# Patient Record
Sex: Female | Born: 1955 | Race: White | Hispanic: No | Smoking: Never smoker
Health system: Southern US, Community
[De-identification: ages and names within clinical notes are randomized; demographics above are authoritative.]

## PROBLEM LIST (undated history)

## (undated) DIAGNOSIS — J309 Allergic rhinitis, unspecified: Secondary | ICD-10-CM

## (undated) DIAGNOSIS — E669 Obesity, unspecified: Secondary | ICD-10-CM

## (undated) DIAGNOSIS — N183 Chronic kidney disease, stage 3 unspecified: Secondary | ICD-10-CM

## (undated) DIAGNOSIS — R7303 Prediabetes: Secondary | ICD-10-CM

## (undated) DIAGNOSIS — J329 Chronic sinusitis, unspecified: Secondary | ICD-10-CM

## (undated) DIAGNOSIS — K7581 Nonalcoholic steatohepatitis (NASH): Secondary | ICD-10-CM

## (undated) DIAGNOSIS — M1990 Unspecified osteoarthritis, unspecified site: Secondary | ICD-10-CM

## (undated) DIAGNOSIS — M8580 Other specified disorders of bone density and structure, unspecified site: Secondary | ICD-10-CM

## (undated) DIAGNOSIS — E7800 Pure hypercholesterolemia, unspecified: Secondary | ICD-10-CM

## (undated) DIAGNOSIS — F32A Depression, unspecified: Secondary | ICD-10-CM

## (undated) DIAGNOSIS — E039 Hypothyroidism, unspecified: Secondary | ICD-10-CM

## (undated) DIAGNOSIS — K219 Gastro-esophageal reflux disease without esophagitis: Secondary | ICD-10-CM

## (undated) DIAGNOSIS — E559 Vitamin D deficiency, unspecified: Secondary | ICD-10-CM

## (undated) DIAGNOSIS — E538 Deficiency of other specified B group vitamins: Secondary | ICD-10-CM

## (undated) DIAGNOSIS — F988 Other specified behavioral and emotional disorders with onset usually occurring in childhood and adolescence: Secondary | ICD-10-CM

## (undated) DIAGNOSIS — M461 Sacroiliitis, not elsewhere classified: Secondary | ICD-10-CM

## (undated) DIAGNOSIS — G43109 Migraine with aura, not intractable, without status migrainosus: Secondary | ICD-10-CM

## (undated) HISTORY — DX: Prediabetes: R73.03

## (undated) HISTORY — DX: Other specified disorders of bone density and structure, unspecified site: M85.80

## (undated) HISTORY — DX: Nonalcoholic steatohepatitis (NASH): K75.81

## (undated) HISTORY — DX: Sacroiliitis, not elsewhere classified: M46.1

## (undated) HISTORY — DX: Chronic sinusitis, unspecified: J32.9

## (undated) HISTORY — DX: Chronic kidney disease, stage 3 unspecified: N18.30

## (undated) HISTORY — PX: OTHER SURGICAL HISTORY: SHX169

## (undated) HISTORY — DX: Pure hypercholesterolemia, unspecified: E78.00

## (undated) HISTORY — DX: Allergic rhinitis, unspecified: J30.9

## (undated) HISTORY — DX: Unspecified osteoarthritis, unspecified site: M19.90

## (undated) HISTORY — DX: Obesity, unspecified: E66.9

## (undated) HISTORY — DX: Other specified behavioral and emotional disorders with onset usually occurring in childhood and adolescence: F98.8

## (undated) HISTORY — PX: ROTATOR CUFF REPAIR: SHX139

## (undated) HISTORY — DX: Vitamin D deficiency, unspecified: E55.9

## (undated) HISTORY — DX: Depression, unspecified: F32.A

## (undated) HISTORY — DX: Deficiency of other specified B group vitamins: E53.8

## (undated) HISTORY — DX: Hypothyroidism, unspecified: E03.9

## (undated) HISTORY — DX: Gastro-esophageal reflux disease without esophagitis: K21.9

## (undated) HISTORY — DX: Migraine with aura, not intractable, without status migrainosus: G43.109

## (undated) HISTORY — PX: CHOLECYSTECTOMY: SHX55

---

## 1997-10-25 ENCOUNTER — Encounter: Admission: RE | Admit: 1997-10-25 | Discharge: 1997-10-25 | Payer: Self-pay | Admitting: Family Medicine

## 1999-04-21 ENCOUNTER — Inpatient Hospital Stay (HOSPITAL_COMMUNITY): Admission: EM | Admit: 1999-04-21 | Discharge: 1999-04-28 | Payer: Self-pay | Admitting: *Deleted

## 1999-04-21 ENCOUNTER — Encounter: Payer: Self-pay | Admitting: *Deleted

## 1999-04-21 ENCOUNTER — Encounter: Payer: Self-pay | Admitting: Surgery

## 1999-04-23 ENCOUNTER — Encounter: Payer: Self-pay | Admitting: General Surgery

## 1999-04-30 ENCOUNTER — Inpatient Hospital Stay (HOSPITAL_COMMUNITY): Admission: EM | Admit: 1999-04-30 | Discharge: 1999-05-02 | Payer: Self-pay | Admitting: Emergency Medicine

## 1999-04-30 ENCOUNTER — Encounter: Payer: Self-pay | Admitting: General Surgery

## 1999-04-30 ENCOUNTER — Ambulatory Visit (HOSPITAL_COMMUNITY): Admission: RE | Admit: 1999-04-30 | Discharge: 1999-04-30 | Payer: Self-pay

## 2000-07-31 ENCOUNTER — Encounter: Payer: Self-pay | Admitting: Family Medicine

## 2000-07-31 ENCOUNTER — Encounter: Admission: RE | Admit: 2000-07-31 | Discharge: 2000-07-31 | Payer: Self-pay | Admitting: Family Medicine

## 2000-09-09 ENCOUNTER — Encounter: Payer: Self-pay | Admitting: Surgery

## 2000-09-09 ENCOUNTER — Encounter: Admission: RE | Admit: 2000-09-09 | Discharge: 2000-09-09 | Payer: Self-pay | Admitting: Surgery

## 2003-09-11 ENCOUNTER — Emergency Department (HOSPITAL_COMMUNITY): Admission: EM | Admit: 2003-09-11 | Discharge: 2003-09-11 | Payer: Self-pay | Admitting: Emergency Medicine

## 2003-09-19 ENCOUNTER — Encounter: Admission: RE | Admit: 2003-09-19 | Discharge: 2003-09-19 | Payer: Self-pay | Admitting: Family Medicine

## 2003-09-25 ENCOUNTER — Ambulatory Visit (HOSPITAL_COMMUNITY): Admission: RE | Admit: 2003-09-25 | Discharge: 2003-09-26 | Payer: Self-pay | Admitting: General Surgery

## 2011-08-25 ENCOUNTER — Other Ambulatory Visit (HOSPITAL_COMMUNITY): Payer: Self-pay | Admitting: Family Medicine

## 2011-08-25 DIAGNOSIS — Z1231 Encounter for screening mammogram for malignant neoplasm of breast: Secondary | ICD-10-CM

## 2011-09-03 ENCOUNTER — Ambulatory Visit (HOSPITAL_COMMUNITY): Payer: Self-pay

## 2011-10-06 ENCOUNTER — Ambulatory Visit (HOSPITAL_COMMUNITY): Payer: Self-pay

## 2011-10-31 ENCOUNTER — Ambulatory Visit (HOSPITAL_COMMUNITY)
Admission: RE | Admit: 2011-10-31 | Discharge: 2011-10-31 | Disposition: A | Discharge status: Home / Self Care | Payer: BC Managed Care – PPO | Source: Ambulatory Visit | Attending: Family Medicine | Admitting: Family Medicine

## 2011-10-31 DIAGNOSIS — Z1231 Encounter for screening mammogram for malignant neoplasm of breast: Secondary | ICD-10-CM

## 2013-05-31 ENCOUNTER — Other Ambulatory Visit: Payer: Self-pay | Admitting: Gastroenterology

## 2013-05-31 DIAGNOSIS — R7989 Other specified abnormal findings of blood chemistry: Secondary | ICD-10-CM

## 2013-06-03 ENCOUNTER — Ambulatory Visit
Admission: RE | Admit: 2013-06-03 | Discharge: 2013-06-03 | Disposition: A | Discharge status: Home / Self Care | Payer: BC Managed Care – PPO | Source: Ambulatory Visit | Attending: Gastroenterology | Admitting: Gastroenterology

## 2013-06-03 DIAGNOSIS — R7989 Other specified abnormal findings of blood chemistry: Secondary | ICD-10-CM

## 2014-01-06 ENCOUNTER — Other Ambulatory Visit (HOSPITAL_COMMUNITY): Payer: Self-pay | Admitting: Gastroenterology

## 2014-01-06 DIAGNOSIS — R945 Abnormal results of liver function studies: Principal | ICD-10-CM

## 2014-01-06 DIAGNOSIS — R7989 Other specified abnormal findings of blood chemistry: Secondary | ICD-10-CM

## 2014-01-10 ENCOUNTER — Encounter (HOSPITAL_COMMUNITY): Payer: Self-pay | Admitting: Pharmacy Technician

## 2014-01-12 ENCOUNTER — Other Ambulatory Visit: Payer: Self-pay | Admitting: Radiology

## 2014-01-16 ENCOUNTER — Ambulatory Visit (HOSPITAL_COMMUNITY)
Admission: RE | Admit: 2014-01-16 | Discharge: 2014-01-16 | Disposition: A | Discharge status: Home / Self Care | Payer: BC Managed Care – PPO | Source: Ambulatory Visit | Attending: Gastroenterology | Admitting: Gastroenterology

## 2014-01-16 DIAGNOSIS — K7689 Other specified diseases of liver: Secondary | ICD-10-CM | POA: Insufficient documentation

## 2014-01-16 DIAGNOSIS — R945 Abnormal results of liver function studies: Secondary | ICD-10-CM

## 2014-01-16 DIAGNOSIS — R7989 Other specified abnormal findings of blood chemistry: Secondary | ICD-10-CM | POA: Insufficient documentation

## 2014-01-16 LAB — CBC
HEMATOCRIT: 38.9 % (ref 36.0–46.0)
HEMOGLOBIN: 13.2 g/dL (ref 12.0–15.0)
MCH: 27.8 pg (ref 26.0–34.0)
MCHC: 33.9 g/dL (ref 30.0–36.0)
MCV: 82.1 fL (ref 78.0–100.0)
Platelets: 212 10*3/uL (ref 150–400)
RBC: 4.74 MIL/uL (ref 3.87–5.11)
RDW: 15.5 % (ref 11.5–15.5)
WBC: 6.2 10*3/uL (ref 4.0–10.5)

## 2014-01-16 LAB — APTT: APTT: 29 s (ref 24–37)

## 2014-01-16 LAB — PROTIME-INR
INR: 0.99 (ref 0.00–1.49)
PROTHROMBIN TIME: 13.1 s (ref 11.6–15.2)

## 2014-01-16 MED ORDER — MIDAZOLAM HCL 2 MG/2ML IJ SOLN
INTRAMUSCULAR | Status: AC
  Filled 2014-01-16: qty 6

## 2014-01-16 MED ORDER — HYDROCODONE-ACETAMINOPHEN 5-325 MG PO TABS: 1.0000 | ORAL_TABLET | ORAL | Status: DC | PRN

## 2014-01-16 MED ORDER — GELATIN ABSORBABLE 12-7 MM EX MISC
CUTANEOUS | Status: AC
  Filled 2014-01-16: qty 1

## 2014-01-16 MED ORDER — FENTANYL CITRATE 0.05 MG/ML IJ SOLN
INTRAMUSCULAR | Status: AC
  Filled 2014-01-16: qty 4

## 2014-01-16 MED ORDER — FENTANYL CITRATE 0.05 MG/ML IJ SOLN
INTRAMUSCULAR | Status: AC | PRN
  Administered 2014-01-16 (×2): 50 ug via INTRAVENOUS

## 2014-01-16 MED ORDER — MIDAZOLAM HCL 2 MG/2ML IJ SOLN
INTRAMUSCULAR | Status: AC | PRN
  Administered 2014-01-16: 1 mg via INTRAVENOUS
  Administered 2014-01-16: 2 mg via INTRAVENOUS

## 2014-01-16 MED ORDER — SODIUM CHLORIDE 0.9 % IV SOLN: INTRAVENOUS | Status: DC
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
## 2014-01-16 NOTE — Procedures (Signed)
Interventional Radiology Procedure Note  Procedure: US guided random liver biopsy Complications: None Recommendations: - Bedrest x 3 hrs  Signed,  Criselda Peaches, MD Vascular & Interventional Radiologist [facility]

## 2014-01-16 NOTE — Discharge Instructions (Signed)
Liver Biopsy, Care After °These instructions give you information on caring for yourself after your procedure. Your doctor may also give you more specific instructions. Call your doctor if you have any problems or questions after your procedure. °HOME CARE °· Rest at home for 1-2 days or as told by your doctor. °· Have someone stay with you for at least 24 hours. °· Do not do these things in the first 24 hours: °¨ Drive. °¨ Use machinery. °¨ Take care of other people. °¨ Sign legal documents. °¨ Take a bath or shower. °· There are many different ways to close and cover a cut (incision). For example, a cut can be closed with stitches, skin glue, or adhesive strips. Follow your doctor's instructions on: °¨ Taking care of your cut. °¨ Changing and removing your bandage (dressing). °¨ Removing whatever was used to close your cut. °· Do not drink alcohol in the first week. °· Do not lift more than 5 pounds or play contact sports for the first 2 weeks. °· Take medicines only as told by your doctor. For 1 week, do not take medicine that has aspirin in it or medicines like ibuprofen. °· Get your test results. °GET HELP IF: °· A cut bleeds and leaves more than just a small spot of blood. °· A cut is red, puffs up (swells), or hurts more than before. °· Fluid or something else comes from a cut. °· A cut smells bad. °· You have a fever or chills. °GET HELP RIGHT AWAY IF: °· You have swelling, bloating, or pain in your belly (abdomen). °· You get dizzy or faint. °· You have a rash. °· You feel sick to your stomach (nauseous) or throw up (vomit). °· You have trouble breathing, feel short of breath, or feel faint. °· Your chest hurts. °· You have problems talking or seeing. °· You have trouble balancing or moving your arms or legs. °Document Released: 03/11/2008 Document Revised: 10/17/2013 Document Reviewed: 07/29/2013 °ExitCare® Patient Information ©2015 [employer]. This information is not intended to replace advice given to  you by your health care provider. Make sure you discuss any questions you have with your health care provider. ° °

## 2015-09-08 IMAGING — US US BIOPSY
1 series · 8 of 8 positions shown · non-contrast
Comparison: none

CLINICAL DATA: 58-year-old female with hepatic steatosis and
elevated liver enzymes. Random liver biopsy to assess for
fibrosis/cirrhosis.
TECHNIQUE: Informed consent was obtained from the patient following explanation
of the procedure, risks, benefits and alternatives. The patient
understands, agrees and consents for the procedure. All questions
were addressed. A time out was performed.

[Series 1: us biopsy · 0.22mm/px · 8 acquisitions, 8 frames shown]
[im 1/8]
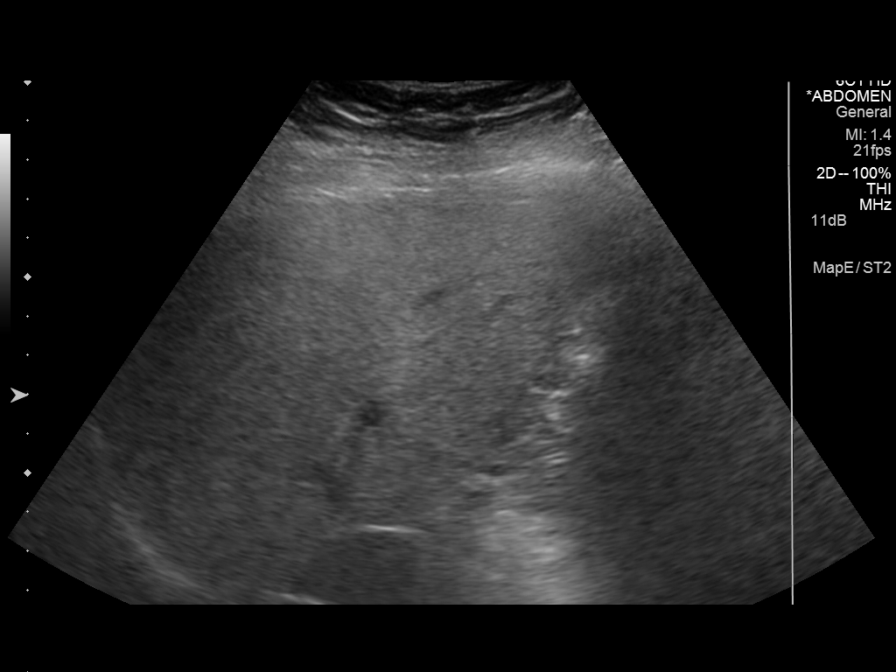
[im 2/8]
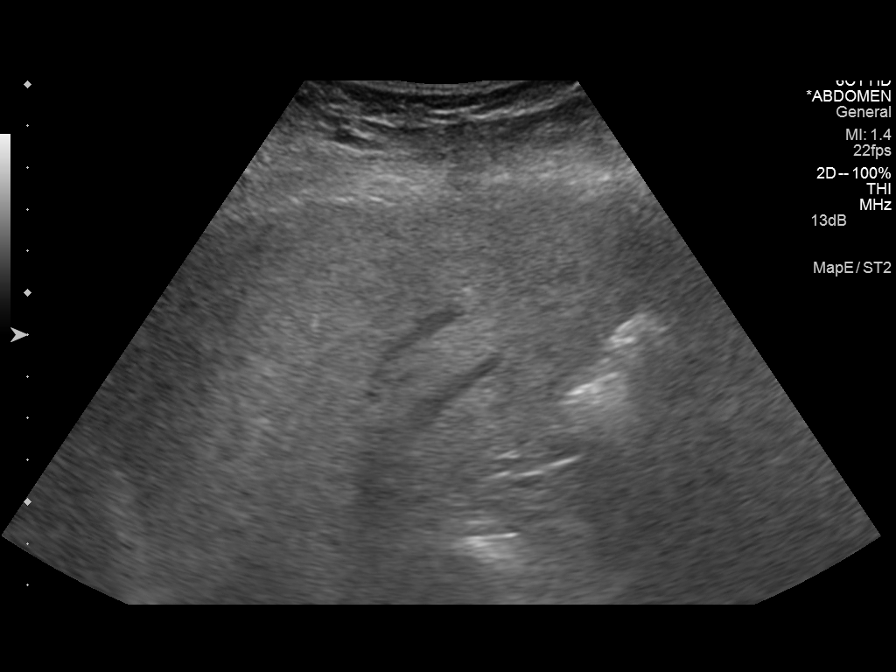
[im 3/8]
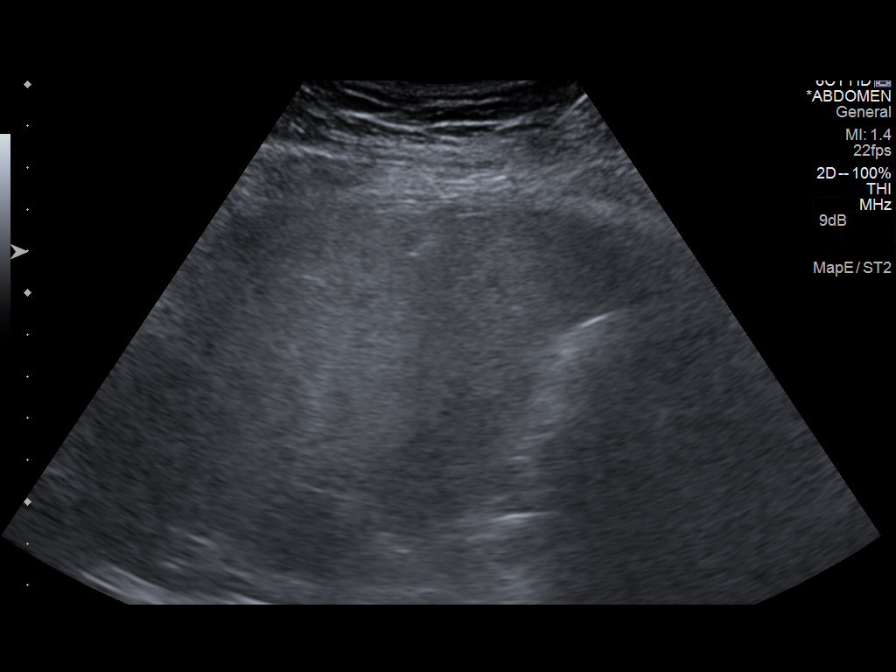
[im 4/8]
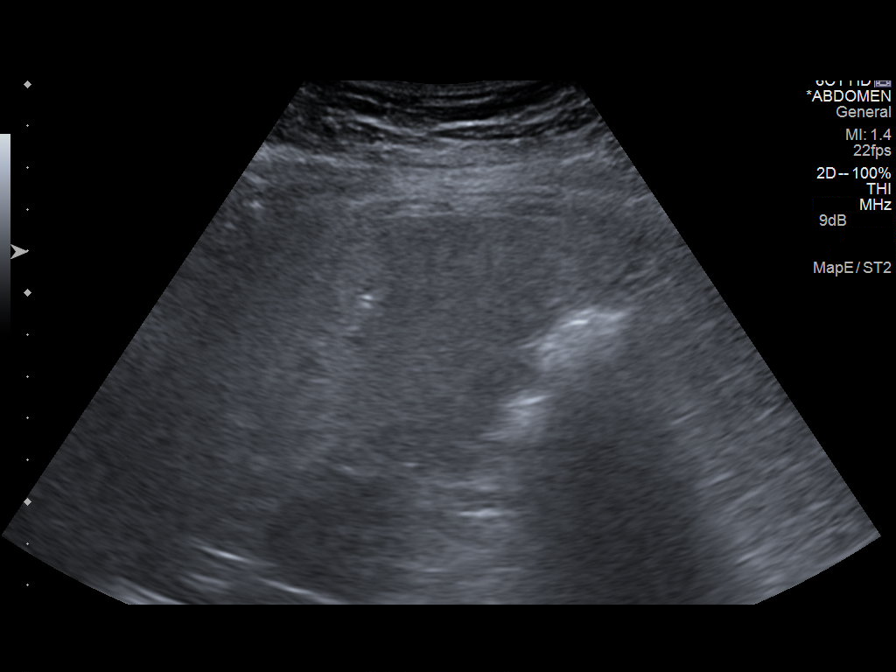
[im 5/8]
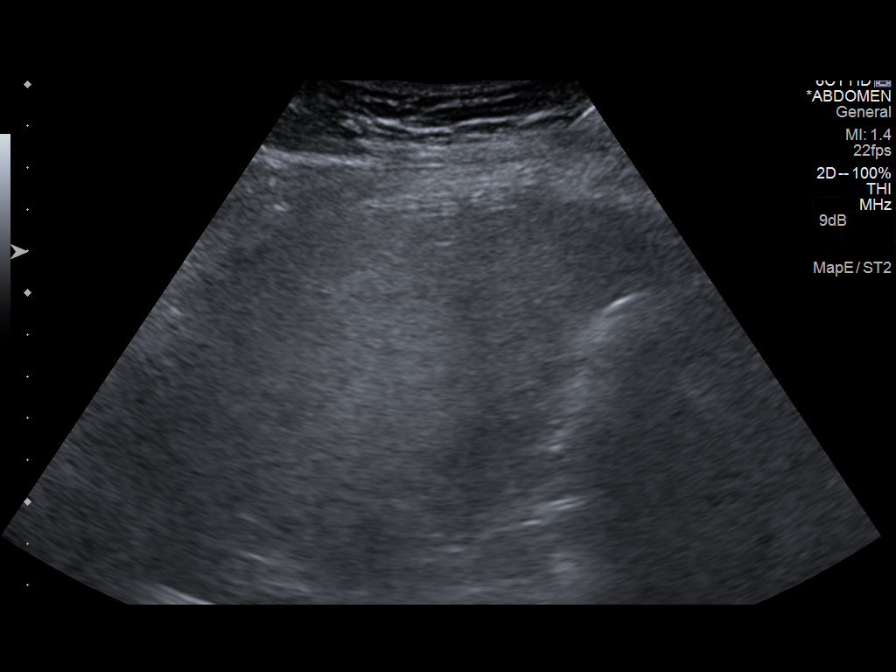
[im 6/8]
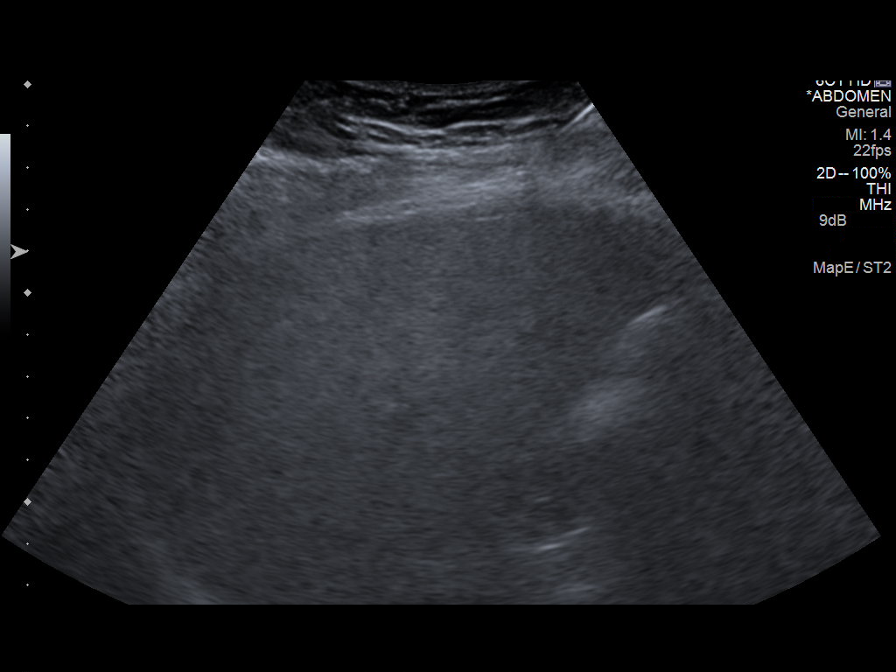
[im 7/8]
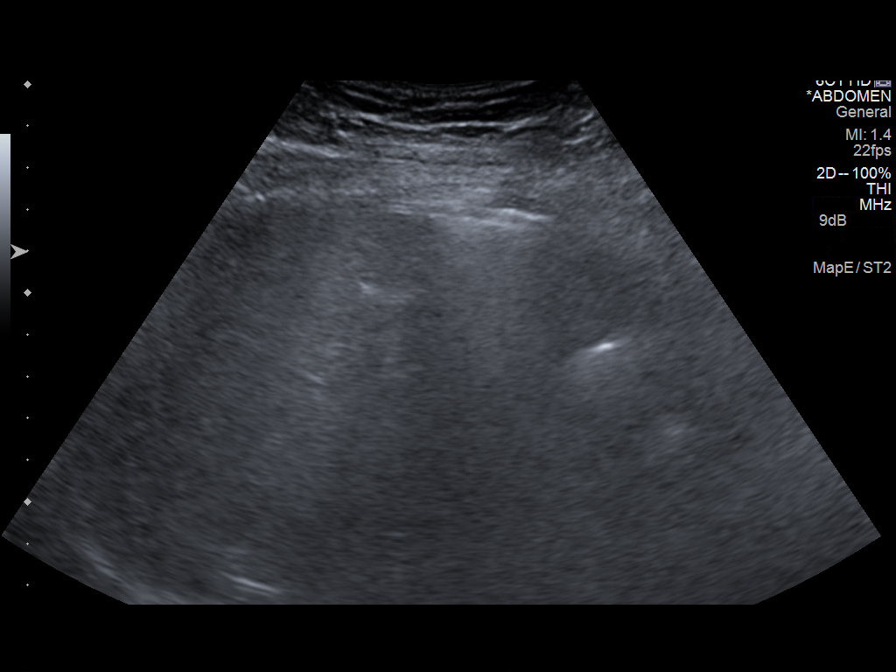
[im 8/8]
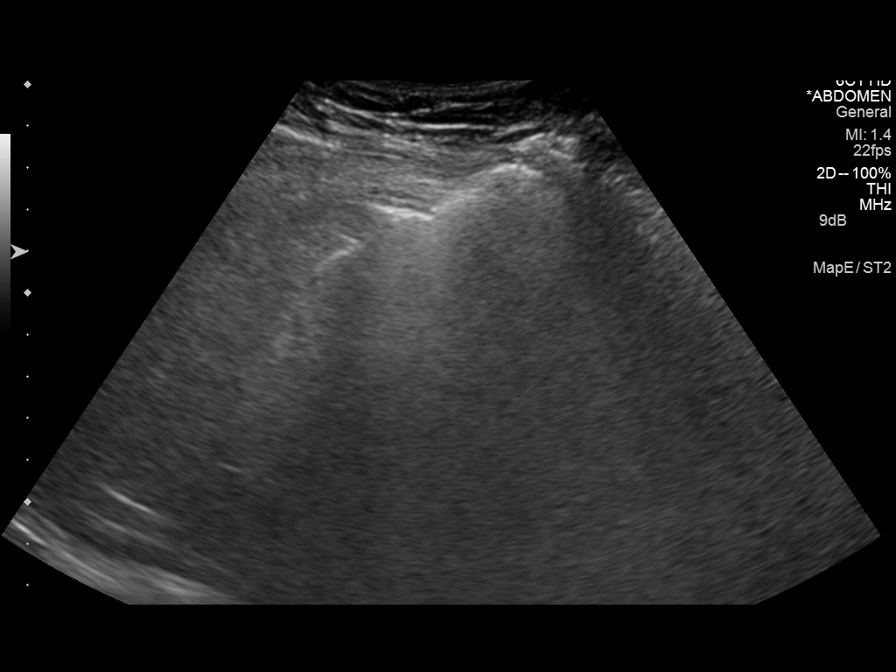

[8 of 8 positions shown; findings below may reference images not displayed]

EXAM:
ULTRASOUND BIOPSY CORE LIVER

Date: 01/16/2014

PROCEDURE:
1. Ultrasound-guided random liver biopsy

ANESTHESIA/SEDATION:
Moderate (conscious) sedation was used. 31 mg Versed, 100 mcg
Fentanyl were administered intravenously. The patient's vital signs
were monitored continuously by radiology nursing throughout the
procedure.

Sedation Time: 14 minutes
The right upper quadrant was interrogated with ultrasound. A
relatively avascular segment of the right hepatic lobe was
identified. A suitable skin entry site was selected and marked. The
region was then sterilely prepped and draped in the standard fashion
using Betadine skin prep.

Local anesthesia was attained by infiltration with 1% lidocaine. A
small dermatotomy was made. Under real-time sonographic guidance, a
17 gauge trocar needle was advanced through the right upper quadrant
anterior abdominal wall and into the margin of the liver. Multiple
18 gauge core biopsies were then coaxially obtained using the
BioPince automated biopsy device. Ultrasound imaging confirmed
intrahepatic location on all needle passes. The biopsy specimens
were placed in formalin and delivered to pathology for further
analysis.

As the 17 gauge trocar needle was removed the biopsy tract was
embolized with a Gel-Foam slurry. Post biopsy imaging demonstrates
no active hemorrhage or perihepatic hematoma. The patient tolerated
the procedure very well.
IMPRESSION: Technically successful ultrasound-guided random core biopsy of the
liver.

## 2015-10-01 DIAGNOSIS — F329 Major depressive disorder, single episode, unspecified: Secondary | ICD-10-CM | POA: Diagnosis not present

## 2015-10-10 DIAGNOSIS — M179 Osteoarthritis of knee, unspecified: Secondary | ICD-10-CM | POA: Diagnosis not present

## 2015-10-10 DIAGNOSIS — R002 Palpitations: Secondary | ICD-10-CM | POA: Diagnosis not present

## 2015-10-10 DIAGNOSIS — E538 Deficiency of other specified B group vitamins: Secondary | ICD-10-CM | POA: Diagnosis not present

## 2015-10-16 DIAGNOSIS — K760 Fatty (change of) liver, not elsewhere classified: Secondary | ICD-10-CM | POA: Diagnosis not present

## 2015-10-16 DIAGNOSIS — D120 Benign neoplasm of cecum: Secondary | ICD-10-CM | POA: Diagnosis not present

## 2015-11-07 DIAGNOSIS — M1712 Unilateral primary osteoarthritis, left knee: Secondary | ICD-10-CM | POA: Diagnosis not present

## 2015-11-09 ENCOUNTER — Other Ambulatory Visit: Payer: Self-pay | Admitting: Physician Assistant

## 2015-11-09 DIAGNOSIS — K760 Fatty (change of) liver, not elsewhere classified: Secondary | ICD-10-CM | POA: Diagnosis not present

## 2015-11-09 DIAGNOSIS — M1712 Unilateral primary osteoarthritis, left knee: Secondary | ICD-10-CM

## 2015-11-16 ENCOUNTER — Ambulatory Visit
Admission: RE | Admit: 2015-11-16 | Discharge: 2015-11-16 | Disposition: A | Discharge status: Home / Self Care | Payer: BLUE CROSS/BLUE SHIELD | Source: Ambulatory Visit | Attending: Physician Assistant | Admitting: Physician Assistant

## 2015-11-16 DIAGNOSIS — M1712 Unilateral primary osteoarthritis, left knee: Secondary | ICD-10-CM

## 2015-11-16 DIAGNOSIS — M25462 Effusion, left knee: Secondary | ICD-10-CM | POA: Diagnosis not present

## 2015-12-14 DIAGNOSIS — M1712 Unilateral primary osteoarthritis, left knee: Secondary | ICD-10-CM | POA: Diagnosis not present

## 2016-01-28 DIAGNOSIS — K7581 Nonalcoholic steatohepatitis (NASH): Secondary | ICD-10-CM | POA: Diagnosis not present

## 2016-01-30 ENCOUNTER — Other Ambulatory Visit (HOSPITAL_COMMUNITY): Payer: Self-pay | Admitting: Gastroenterology

## 2016-01-30 DIAGNOSIS — K7581 Nonalcoholic steatohepatitis (NASH): Secondary | ICD-10-CM

## 2016-03-07 DIAGNOSIS — M1712 Unilateral primary osteoarthritis, left knee: Secondary | ICD-10-CM | POA: Diagnosis not present

## 2016-03-12 ENCOUNTER — Ambulatory Visit (HOSPITAL_COMMUNITY)
Admission: RE | Admit: 2016-03-12 | Discharge: 2016-03-12 | Disposition: A | Discharge status: Home / Self Care | Payer: BLUE CROSS/BLUE SHIELD | Source: Ambulatory Visit | Attending: Gastroenterology | Admitting: Gastroenterology

## 2016-03-12 DIAGNOSIS — K7581 Nonalcoholic steatohepatitis (NASH): Secondary | ICD-10-CM | POA: Diagnosis not present

## 2016-03-12 DIAGNOSIS — Z9049 Acquired absence of other specified parts of digestive tract: Secondary | ICD-10-CM | POA: Insufficient documentation

## 2016-03-14 DIAGNOSIS — M1712 Unilateral primary osteoarthritis, left knee: Secondary | ICD-10-CM | POA: Diagnosis not present

## 2016-03-24 DIAGNOSIS — K760 Fatty (change of) liver, not elsewhere classified: Secondary | ICD-10-CM | POA: Diagnosis not present

## 2016-03-26 DIAGNOSIS — M1712 Unilateral primary osteoarthritis, left knee: Secondary | ICD-10-CM | POA: Diagnosis not present

## 2016-03-31 DIAGNOSIS — K7581 Nonalcoholic steatohepatitis (NASH): Secondary | ICD-10-CM | POA: Diagnosis not present

## 2016-03-31 DIAGNOSIS — K760 Fatty (change of) liver, not elsewhere classified: Secondary | ICD-10-CM | POA: Diagnosis not present

## 2016-04-02 DIAGNOSIS — F329 Major depressive disorder, single episode, unspecified: Secondary | ICD-10-CM | POA: Diagnosis not present

## 2016-04-07 DIAGNOSIS — Z23 Encounter for immunization: Secondary | ICD-10-CM | POA: Diagnosis not present

## 2016-04-07 DIAGNOSIS — E538 Deficiency of other specified B group vitamins: Secondary | ICD-10-CM | POA: Diagnosis not present

## 2016-04-07 DIAGNOSIS — R1010 Upper abdominal pain, unspecified: Secondary | ICD-10-CM | POA: Diagnosis not present

## 2016-04-16 DIAGNOSIS — K7581 Nonalcoholic steatohepatitis (NASH): Secondary | ICD-10-CM | POA: Diagnosis not present

## 2016-04-18 DIAGNOSIS — R1032 Left lower quadrant pain: Secondary | ICD-10-CM | POA: Diagnosis not present

## 2016-04-18 DIAGNOSIS — K7581 Nonalcoholic steatohepatitis (NASH): Secondary | ICD-10-CM | POA: Diagnosis not present

## 2016-04-25 ENCOUNTER — Other Ambulatory Visit: Payer: Self-pay | Admitting: Family Medicine

## 2016-04-25 DIAGNOSIS — R1032 Left lower quadrant pain: Secondary | ICD-10-CM

## 2016-05-02 ENCOUNTER — Ambulatory Visit
Admission: RE | Admit: 2016-05-02 | Discharge: 2016-05-02 | Disposition: A | Discharge status: Home / Self Care | Payer: BLUE CROSS/BLUE SHIELD | Source: Ambulatory Visit | Attending: Family Medicine | Admitting: Family Medicine

## 2016-05-02 DIAGNOSIS — R1032 Left lower quadrant pain: Secondary | ICD-10-CM | POA: Diagnosis not present

## 2016-05-02 MED ORDER — IOPAMIDOL (ISOVUE-300) INJECTION 61%
100.0000 mL | Freq: Once | INTRAVENOUS | Status: AC | PRN
  Administered 2016-05-02: 100 mL via INTRAVENOUS

## 2016-05-21 DIAGNOSIS — Z1231 Encounter for screening mammogram for malignant neoplasm of breast: Secondary | ICD-10-CM | POA: Diagnosis not present

## 2016-05-21 DIAGNOSIS — M8588 Other specified disorders of bone density and structure, other site: Secondary | ICD-10-CM | POA: Diagnosis not present

## 2016-06-19 DIAGNOSIS — M1712 Unilateral primary osteoarthritis, left knee: Secondary | ICD-10-CM | POA: Diagnosis not present

## 2016-07-21 DIAGNOSIS — F329 Major depressive disorder, single episode, unspecified: Secondary | ICD-10-CM | POA: Diagnosis not present

## 2016-09-08 DIAGNOSIS — K7581 Nonalcoholic steatohepatitis (NASH): Secondary | ICD-10-CM | POA: Insufficient documentation

## 2016-09-08 DIAGNOSIS — K760 Fatty (change of) liver, not elsewhere classified: Secondary | ICD-10-CM | POA: Diagnosis not present

## 2016-09-26 DIAGNOSIS — M1712 Unilateral primary osteoarthritis, left knee: Secondary | ICD-10-CM | POA: Diagnosis not present

## 2016-10-02 DIAGNOSIS — M1712 Unilateral primary osteoarthritis, left knee: Secondary | ICD-10-CM | POA: Diagnosis not present

## 2016-10-10 DIAGNOSIS — M1712 Unilateral primary osteoarthritis, left knee: Secondary | ICD-10-CM | POA: Diagnosis not present

## 2016-10-22 DIAGNOSIS — E538 Deficiency of other specified B group vitamins: Secondary | ICD-10-CM | POA: Diagnosis not present

## 2016-10-22 DIAGNOSIS — E039 Hypothyroidism, unspecified: Secondary | ICD-10-CM | POA: Diagnosis not present

## 2016-10-22 DIAGNOSIS — E559 Vitamin D deficiency, unspecified: Secondary | ICD-10-CM | POA: Diagnosis not present

## 2016-10-22 DIAGNOSIS — Z79899 Other long term (current) drug therapy: Secondary | ICD-10-CM | POA: Diagnosis not present

## 2016-11-14 DIAGNOSIS — Z01419 Encounter for gynecological examination (general) (routine) without abnormal findings: Secondary | ICD-10-CM | POA: Diagnosis not present

## 2016-11-14 DIAGNOSIS — Z6830 Body mass index (BMI) 30.0-30.9, adult: Secondary | ICD-10-CM | POA: Diagnosis not present

## 2017-03-23 DIAGNOSIS — F329 Major depressive disorder, single episode, unspecified: Secondary | ICD-10-CM | POA: Diagnosis not present

## 2017-04-03 DIAGNOSIS — Z713 Dietary counseling and surveillance: Secondary | ICD-10-CM | POA: Diagnosis not present

## 2017-04-16 DIAGNOSIS — N898 Other specified noninflammatory disorders of vagina: Secondary | ICD-10-CM | POA: Diagnosis not present

## 2017-04-17 DIAGNOSIS — E039 Hypothyroidism, unspecified: Secondary | ICD-10-CM | POA: Diagnosis not present

## 2017-04-17 DIAGNOSIS — E538 Deficiency of other specified B group vitamins: Secondary | ICD-10-CM | POA: Diagnosis not present

## 2017-04-17 DIAGNOSIS — Z79899 Other long term (current) drug therapy: Secondary | ICD-10-CM | POA: Diagnosis not present

## 2017-04-17 DIAGNOSIS — E559 Vitamin D deficiency, unspecified: Secondary | ICD-10-CM | POA: Diagnosis not present

## 2017-04-17 DIAGNOSIS — Z23 Encounter for immunization: Secondary | ICD-10-CM | POA: Diagnosis not present

## 2017-05-06 DIAGNOSIS — N898 Other specified noninflammatory disorders of vagina: Secondary | ICD-10-CM | POA: Diagnosis not present

## 2017-05-06 DIAGNOSIS — N950 Postmenopausal bleeding: Secondary | ICD-10-CM | POA: Diagnosis not present

## 2017-05-15 DIAGNOSIS — E039 Hypothyroidism, unspecified: Secondary | ICD-10-CM | POA: Diagnosis not present

## 2017-05-29 DIAGNOSIS — F329 Major depressive disorder, single episode, unspecified: Secondary | ICD-10-CM | POA: Diagnosis not present

## 2017-06-05 DIAGNOSIS — Z6833 Body mass index (BMI) 33.0-33.9, adult: Secondary | ICD-10-CM | POA: Diagnosis not present

## 2017-06-05 DIAGNOSIS — E663 Overweight: Secondary | ICD-10-CM | POA: Diagnosis not present

## 2017-06-05 DIAGNOSIS — R110 Nausea: Secondary | ICD-10-CM | POA: Diagnosis not present

## 2017-06-11 IMAGING — US US ABDOMEN COMPLETE W/ ELASTOGRAPHY
1 series · 13 of 25 positions shown · non-contrast
Comparison: 01/16/2014

CLINICAL DATA: NASH.  Prior cholecystectomy.



[Series 1: us abdomen complete w/ elastography · 0.23mm/px · 13 of 53 slices shown]
[im 1/53]
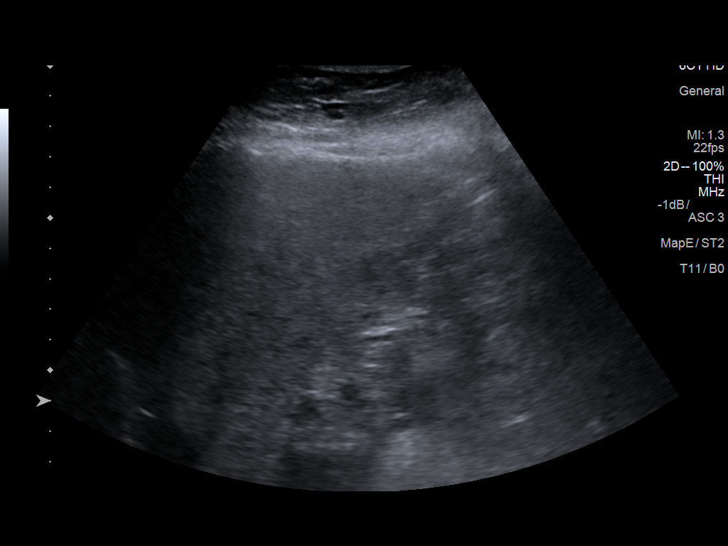
[im 5/53]
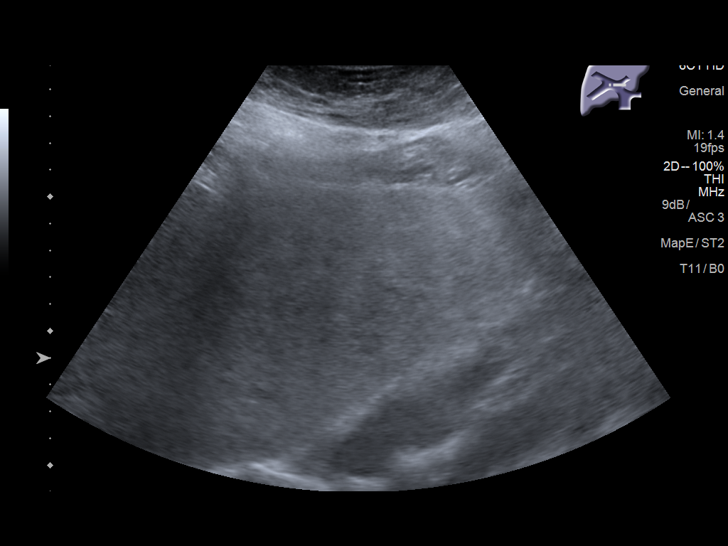
[im 9/53]
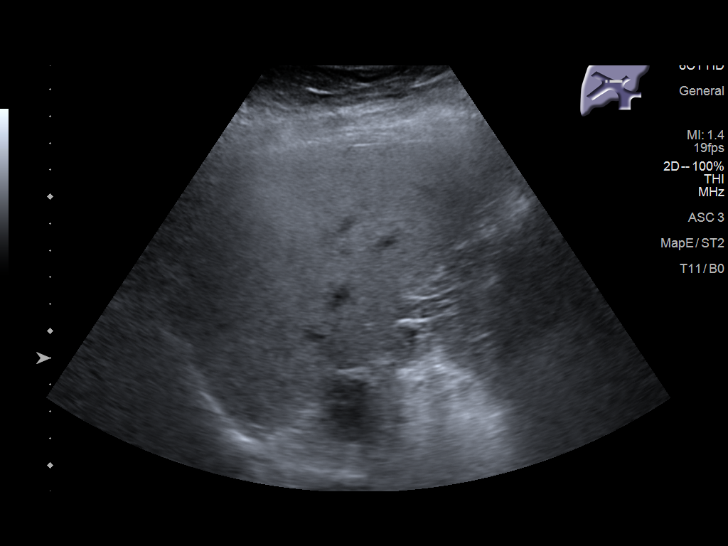
[im 14/53]
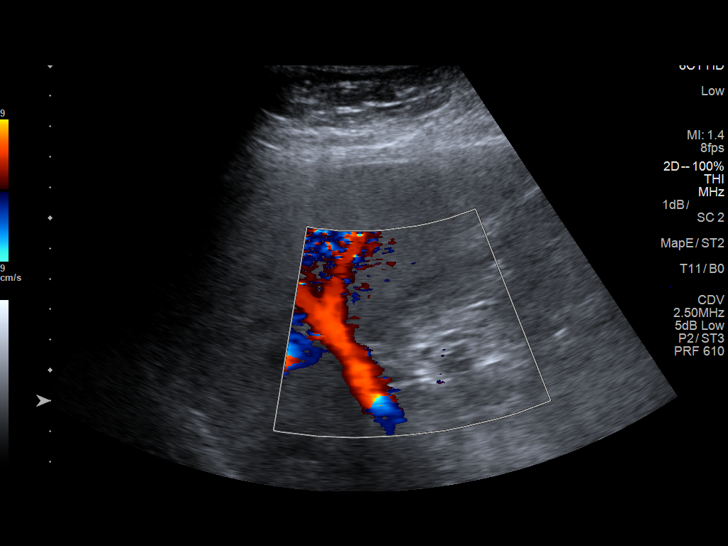
[im 18/53]
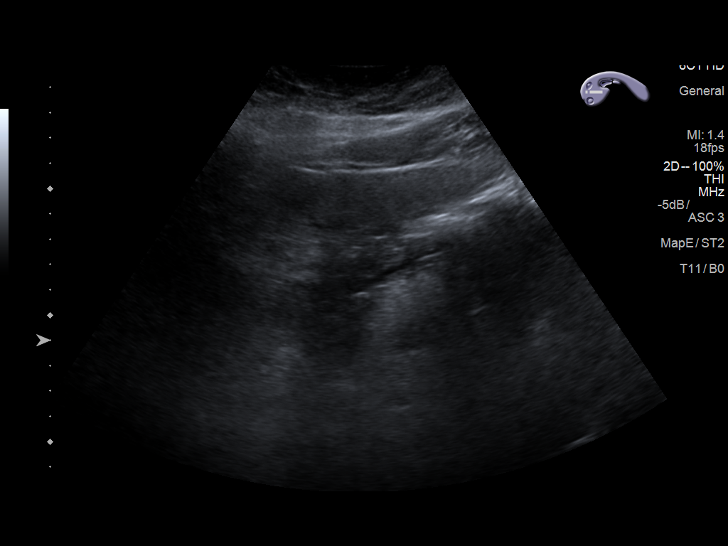
[im 22/53]
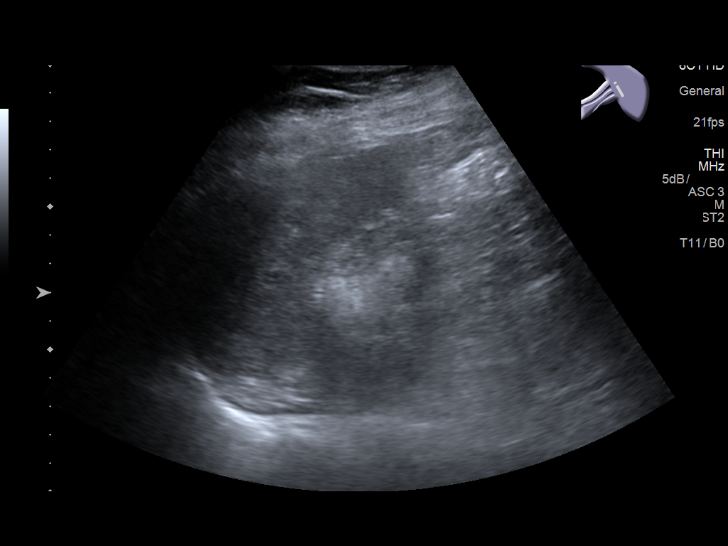
[im 27/53]
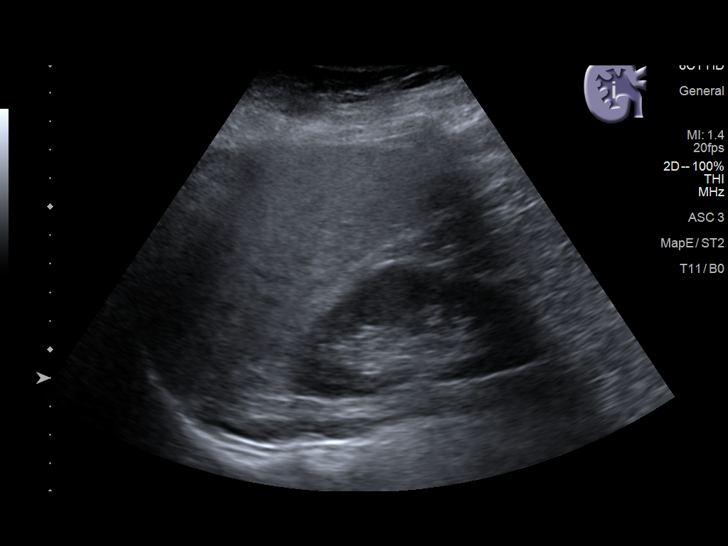
[im 31/53]
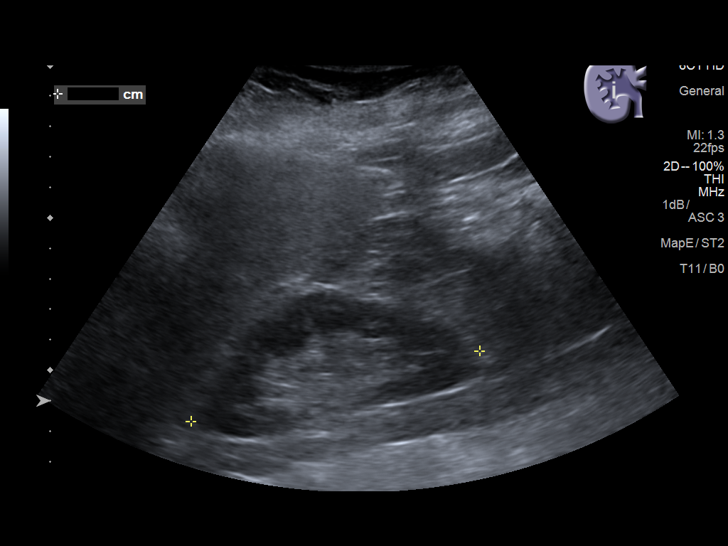
[im 35/53]
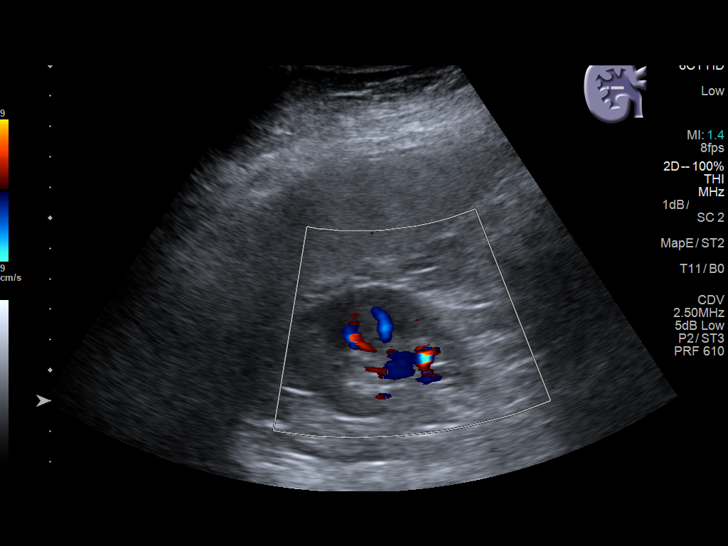
[im 40/53]
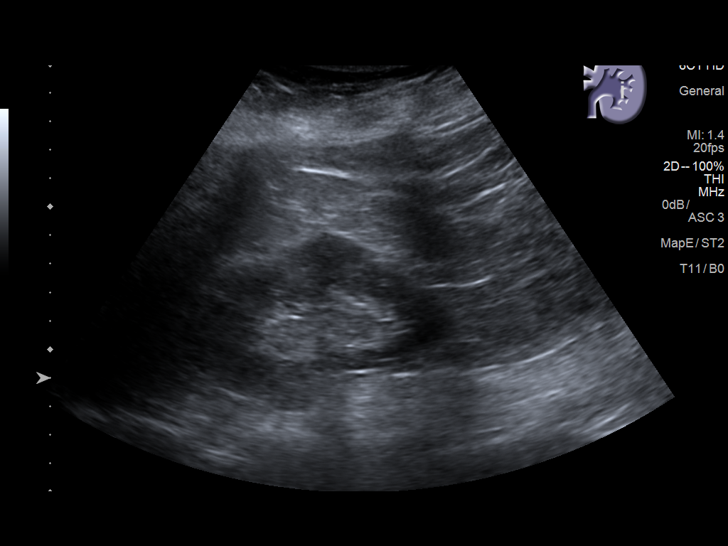
[im 44/53]
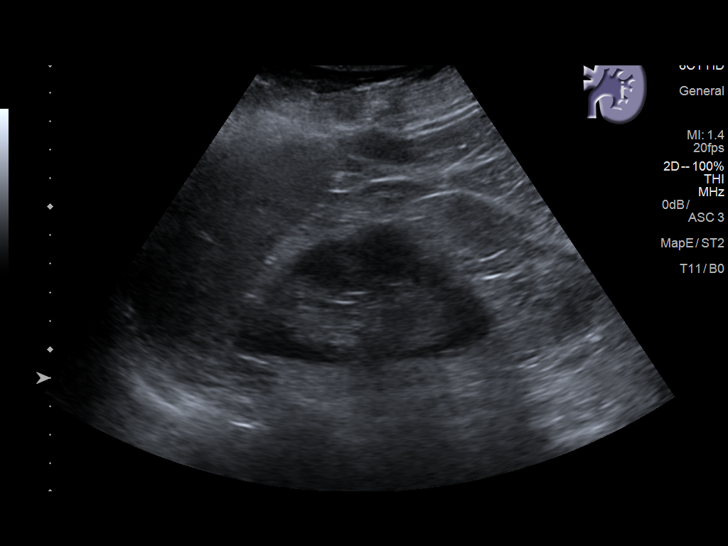
[im 48/53]
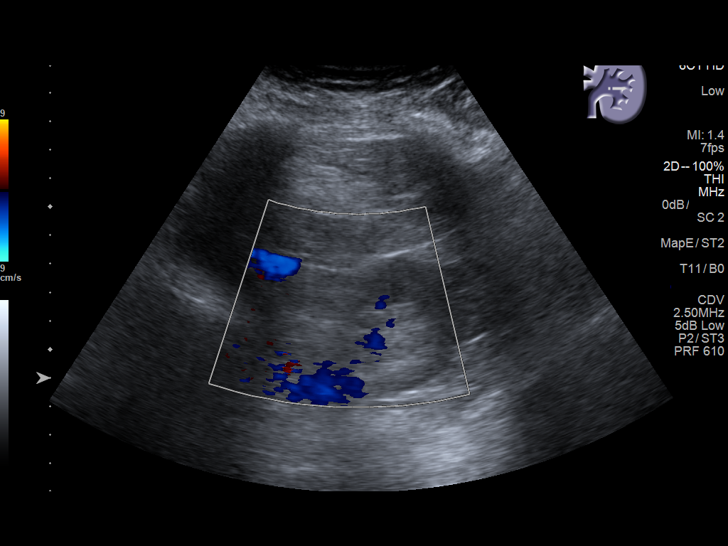
[im 53/53]
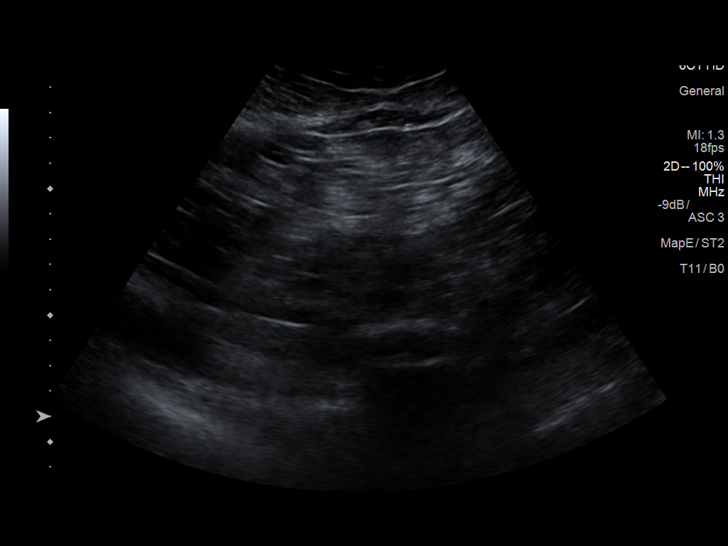

[13 of 25 positions shown; findings below may reference images not displayed]

FINDINGS: ULTRASOUND ABDOMEN

Gallbladder: Cholecystectomy.

Common bile duct: Diameter: 4 mm

Liver: Coarse echogenic liver with poor sonic penetration compatible
with diffuse hepatic steatosis.

IVC: No abnormality visualized.

Pancreas: Not seen due to overlying bowel gas.

Spleen: Size and appearance within normal limits.

Right Kidney: Length: 9.8 cm. Echogenicity within normal limits. No
mass or hydronephrosis visualized.

Left Kidney: Length: 9.3 cm. Echogenicity within normal limits. No
mass or hydronephrosis visualized. Suspected dromedary hump.

Abdominal aorta: No aneurysm visualized. Distal abdominal aorta
obscured by bowel gas.

Other findings: None.

ULTRASOUND HEPATIC ELASTOGRAPHY

Device: Siemens Helix VTQ

Patient position:  Supine

Transducer 6 C1 HD

Number of measurements:  10

Hepatic Segment:  8

Median velocity:   2.69  m/sec

IQR:

IQR/Median velocity ratio

Corresponding Metavir fibrosis score:  Some F3+F4

Risk of fibrosis: High

Limitations of exam: None

Pertinent findings noted on other imaging exams:  None

Please note that abnormal shear wave velocities may also be
identified in clinical settings other than with hepatic fibrosis,
such as: acute hepatitis, elevated right heart and central venous
pressures including use of beta blockers, Alexey disease
(Liridone), infiltrative processes such as
mastocytosis/amyloidosis/infiltrative tumor, extrahepatic
cholestasis, in the post-prandial state, and liver transplantation.
Correlation with patient history, laboratory data, and clinical
condition recommended.
IMPRESSION: 1. Coarse echogenic liver with poor sonic penetration compatible
with diffuse hepatic steatosis.
2. Poor visualization of the pancreas and distal abdominal aorta due
to overlying bowel gas.

Median hepatic shear wave velocity is calculated at 2.69 m/sec.

Corresponding Metavir fibrosis score is Some F3+F4 .

Risk of fibrosis is high.

Follow-up:  Advised

## 2017-06-11 IMAGING — US US ABDOMEN COMPLETE W/ ELASTOGRAPHY
1 series · 13 of 15 positions shown · non-contrast
Comparison: 01/16/2014

CLINICAL DATA: NASH.  Prior cholecystectomy.



[Series 1: us abdomen complete w/ elastography · 0.19mm/px · 13 of 15 slices shown]
[im 1/15]
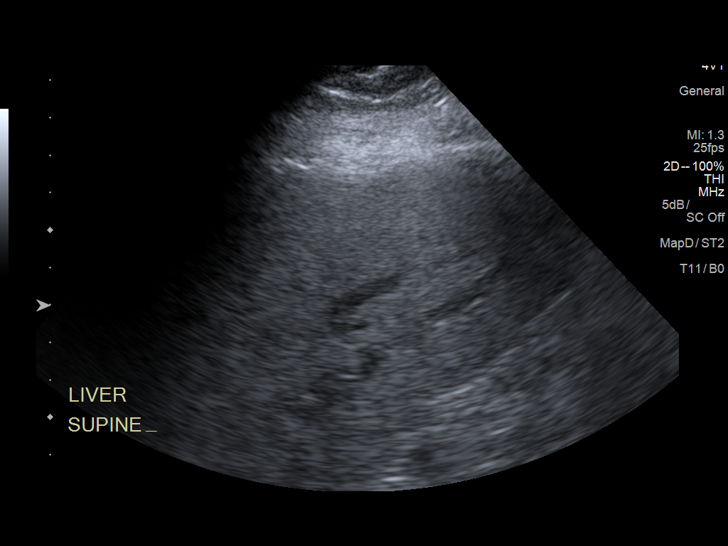
[im 2/15]
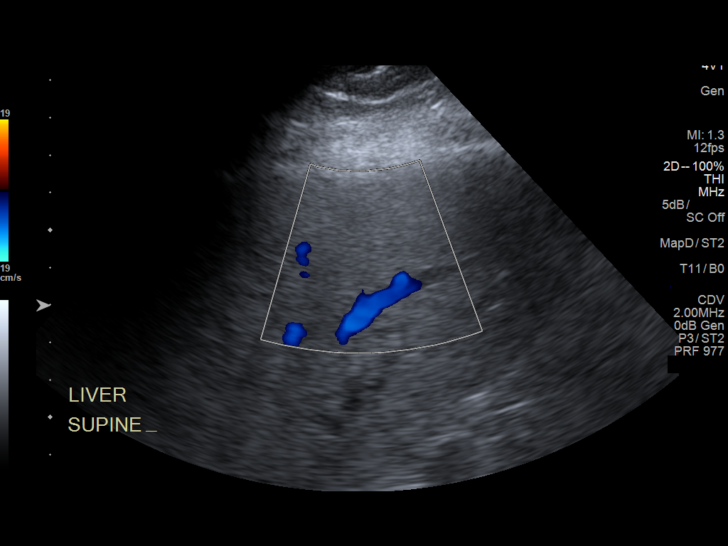
[im 3/15]
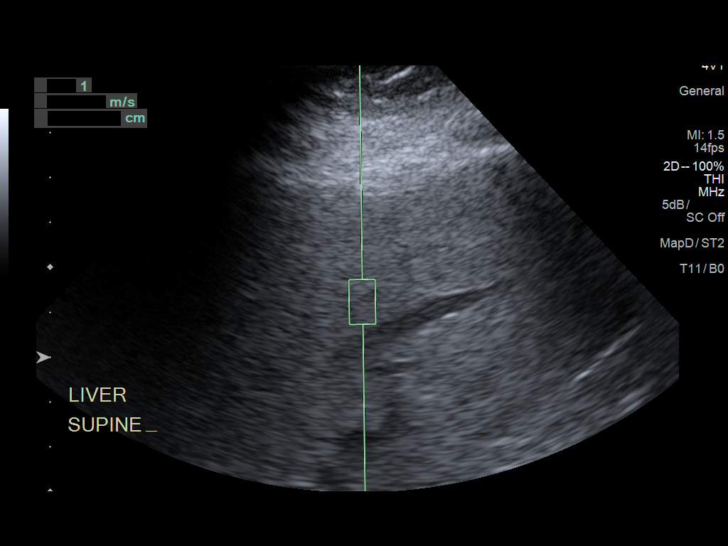
[im 5/15]
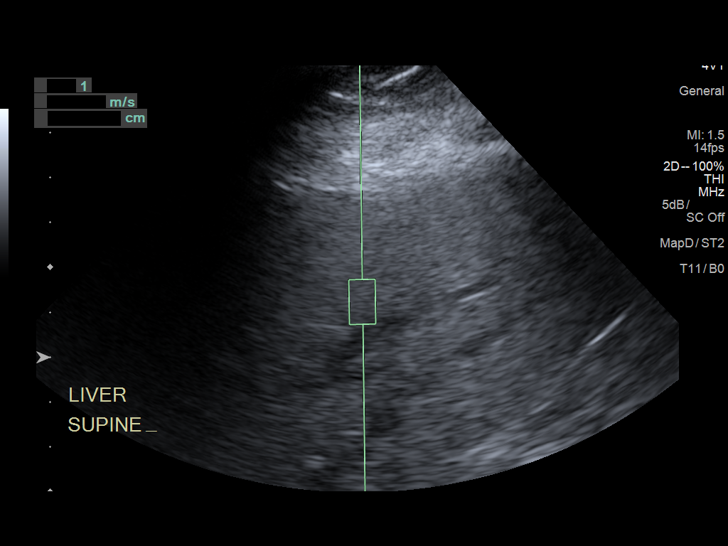
[im 6/15]
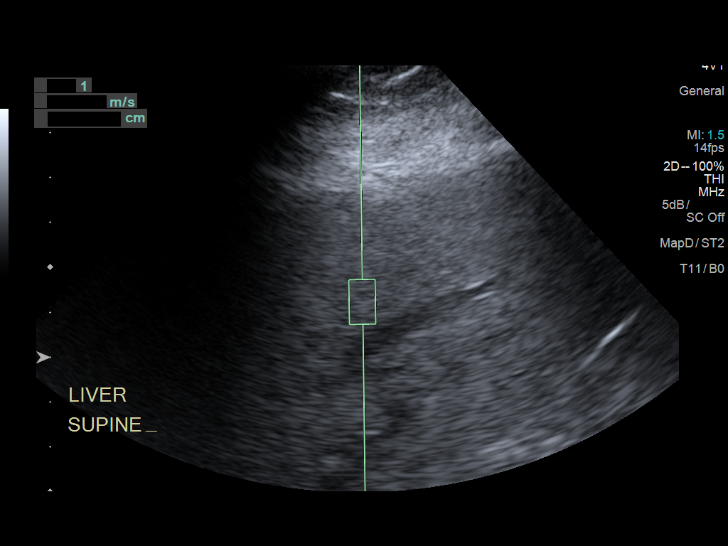
[im 7/15]
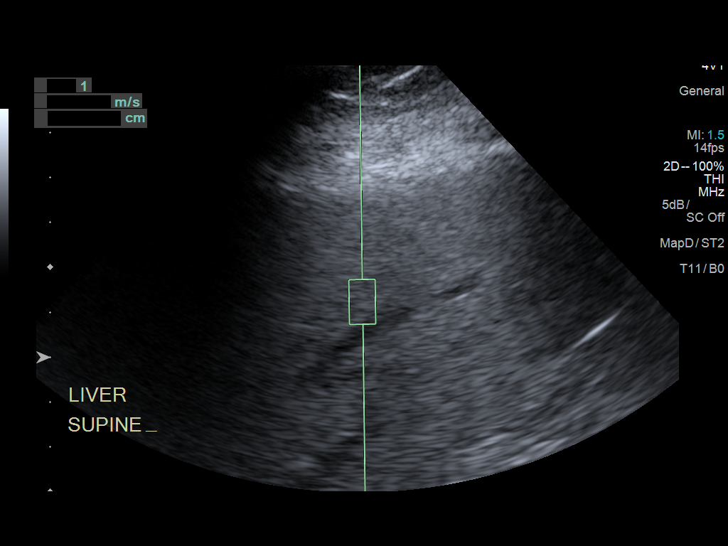
[im 8/15]
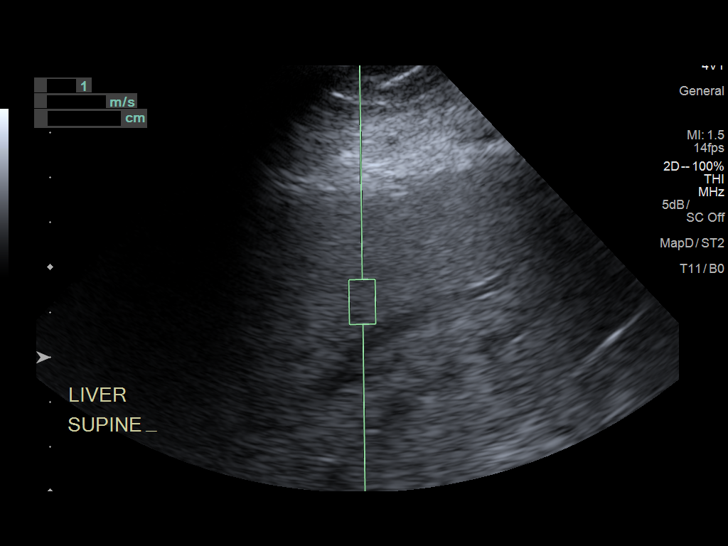
[im 9/15]
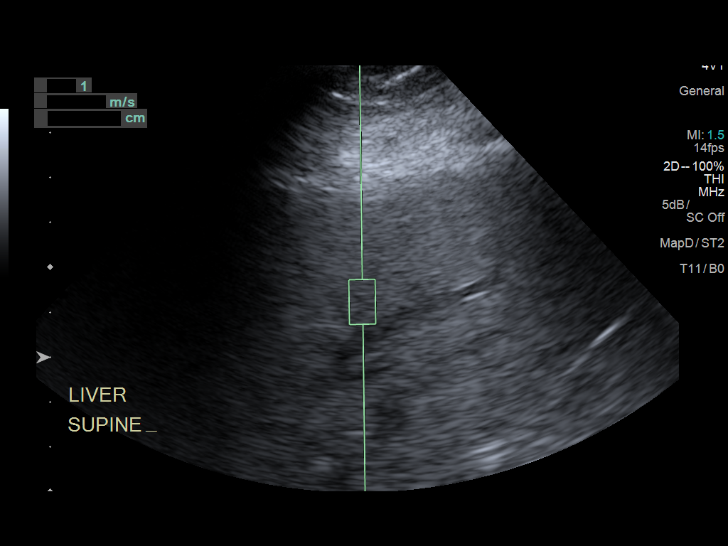
[im 10/15]
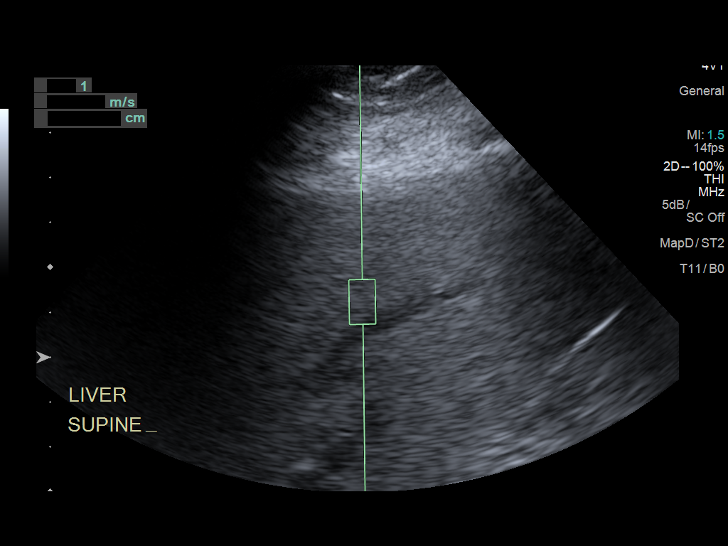
[im 11/15]
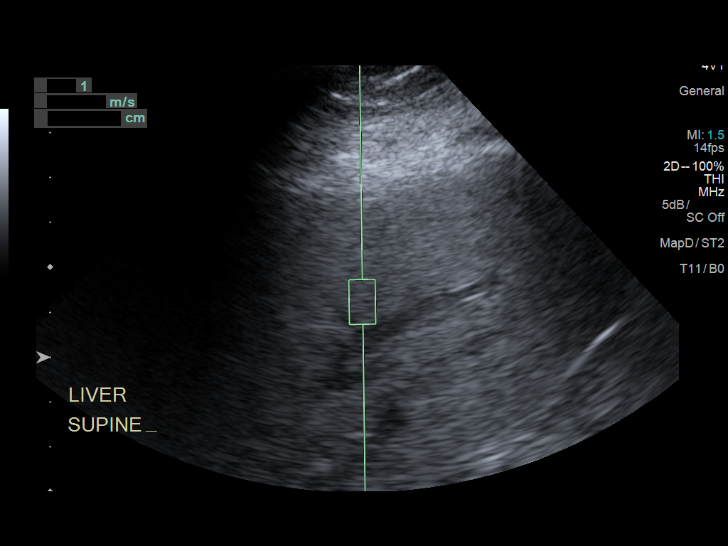
[im 13/15]
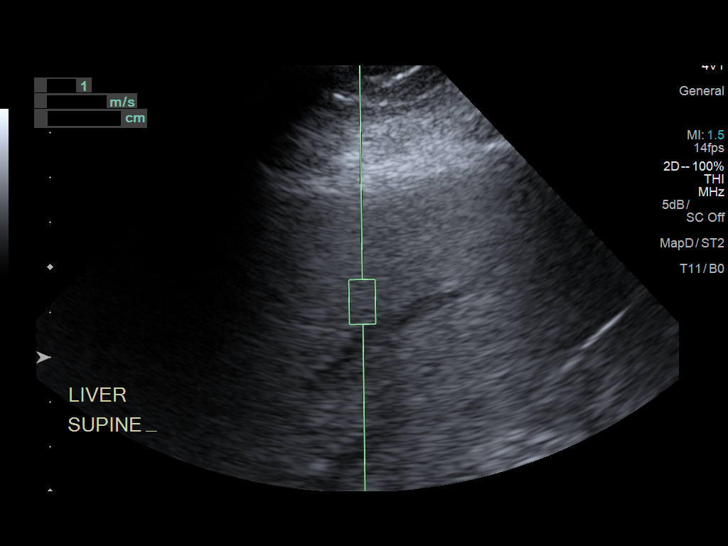
[im 14/15]
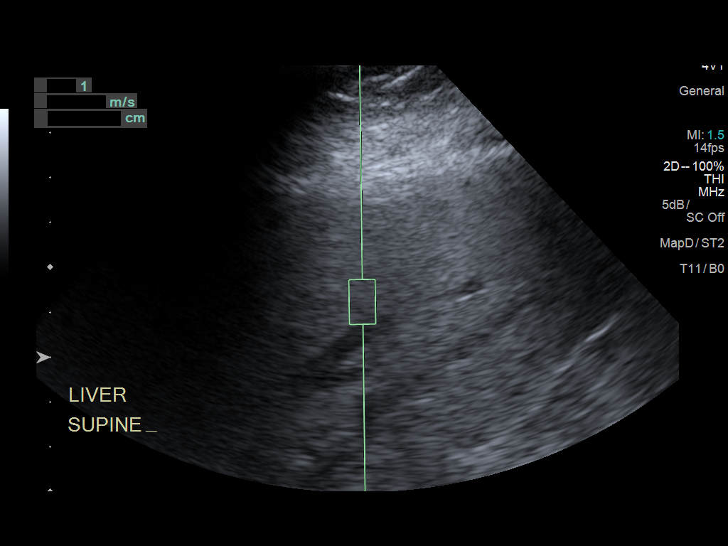
[im 15/15]
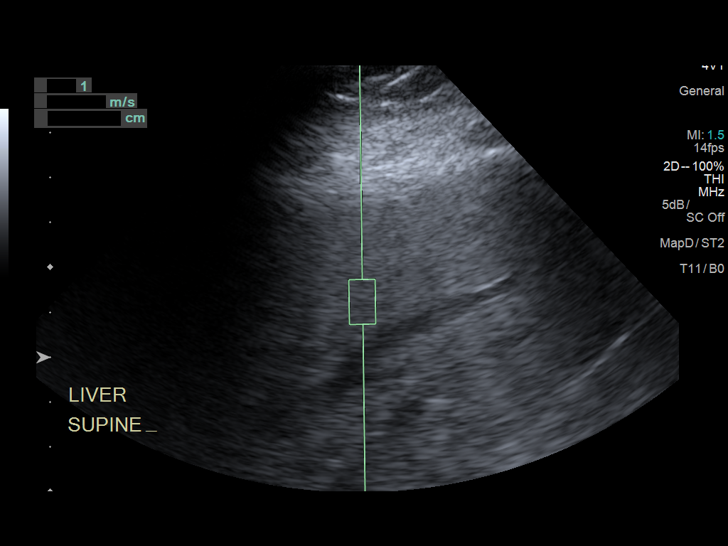

[13 of 15 positions shown; findings below may reference images not displayed]

FINDINGS: ULTRASOUND ABDOMEN

Gallbladder: Cholecystectomy.

Common bile duct: Diameter: 4 mm

Liver: Coarse echogenic liver with poor sonic penetration compatible
with diffuse hepatic steatosis.

IVC: No abnormality visualized.

Pancreas: Not seen due to overlying bowel gas.

Spleen: Size and appearance within normal limits.

Right Kidney: Length: 9.8 cm. Echogenicity within normal limits. No
mass or hydronephrosis visualized.

Left Kidney: Length: 9.3 cm. Echogenicity within normal limits. No
mass or hydronephrosis visualized. Suspected dromedary hump.

Abdominal aorta: No aneurysm visualized. Distal abdominal aorta
obscured by bowel gas.

Other findings: None.

ULTRASOUND HEPATIC ELASTOGRAPHY

Device: Siemens Helix VTQ

Patient position:  Supine

Transducer 6 C1 HD

Number of measurements:  10

Hepatic Segment:  8

Median velocity:   2.69  m/sec

IQR:

IQR/Median velocity ratio

Corresponding Metavir fibrosis score:  Some F3+F4

Risk of fibrosis: High

Limitations of exam: None

Pertinent findings noted on other imaging exams:  None

Please note that abnormal shear wave velocities may also be
identified in clinical settings other than with hepatic fibrosis,
such as: acute hepatitis, elevated right heart and central venous
pressures including use of beta blockers, Alexey disease
(Liridone), infiltrative processes such as
mastocytosis/amyloidosis/infiltrative tumor, extrahepatic
cholestasis, in the post-prandial state, and liver transplantation.
Correlation with patient history, laboratory data, and clinical
condition recommended.
IMPRESSION: 1. Coarse echogenic liver with poor sonic penetration compatible
with diffuse hepatic steatosis.
2. Poor visualization of the pancreas and distal abdominal aorta due
to overlying bowel gas.

Median hepatic shear wave velocity is calculated at 2.69 m/sec.

Corresponding Metavir fibrosis score is Some F3+F4 .

Risk of fibrosis is high.

Follow-up:  Advised

## 2017-07-21 DIAGNOSIS — K7581 Nonalcoholic steatohepatitis (NASH): Secondary | ICD-10-CM | POA: Diagnosis not present

## 2017-07-21 DIAGNOSIS — N183 Chronic kidney disease, stage 3 (moderate): Secondary | ICD-10-CM | POA: Diagnosis not present

## 2017-07-21 DIAGNOSIS — E538 Deficiency of other specified B group vitamins: Secondary | ICD-10-CM | POA: Diagnosis not present

## 2017-07-21 DIAGNOSIS — E039 Hypothyroidism, unspecified: Secondary | ICD-10-CM | POA: Diagnosis not present

## 2017-07-21 DIAGNOSIS — E669 Obesity, unspecified: Secondary | ICD-10-CM | POA: Diagnosis not present

## 2017-09-07 DIAGNOSIS — J029 Acute pharyngitis, unspecified: Secondary | ICD-10-CM | POA: Diagnosis not present

## 2017-09-07 DIAGNOSIS — E538 Deficiency of other specified B group vitamins: Secondary | ICD-10-CM | POA: Diagnosis not present

## 2017-09-07 DIAGNOSIS — M461 Sacroiliitis, not elsewhere classified: Secondary | ICD-10-CM | POA: Diagnosis not present

## 2017-09-07 DIAGNOSIS — N289 Disorder of kidney and ureter, unspecified: Secondary | ICD-10-CM | POA: Diagnosis not present

## 2017-10-05 DIAGNOSIS — F329 Major depressive disorder, single episode, unspecified: Secondary | ICD-10-CM | POA: Diagnosis not present

## 2017-11-24 DIAGNOSIS — E559 Vitamin D deficiency, unspecified: Secondary | ICD-10-CM | POA: Diagnosis not present

## 2017-11-24 DIAGNOSIS — E039 Hypothyroidism, unspecified: Secondary | ICD-10-CM | POA: Diagnosis not present

## 2017-11-24 DIAGNOSIS — N183 Chronic kidney disease, stage 3 (moderate): Secondary | ICD-10-CM | POA: Diagnosis not present

## 2017-11-24 DIAGNOSIS — E538 Deficiency of other specified B group vitamins: Secondary | ICD-10-CM | POA: Diagnosis not present

## 2017-12-01 DIAGNOSIS — R899 Unspecified abnormal finding in specimens from other organs, systems and tissues: Secondary | ICD-10-CM | POA: Diagnosis not present

## 2017-12-05 ENCOUNTER — Other Ambulatory Visit: Payer: Self-pay | Admitting: Nephrology

## 2017-12-05 DIAGNOSIS — N183 Chronic kidney disease, stage 3 unspecified: Secondary | ICD-10-CM

## 2017-12-11 ENCOUNTER — Ambulatory Visit
Admission: RE | Admit: 2017-12-11 | Discharge: 2017-12-11 | Disposition: A | Discharge status: Home / Self Care | Payer: BLUE CROSS/BLUE SHIELD | Source: Ambulatory Visit | Attending: Nephrology | Admitting: Nephrology

## 2017-12-11 DIAGNOSIS — N183 Chronic kidney disease, stage 3 unspecified: Secondary | ICD-10-CM

## 2018-02-10 DIAGNOSIS — M25561 Pain in right knee: Secondary | ICD-10-CM | POA: Insufficient documentation

## 2018-03-03 DIAGNOSIS — M1712 Unilateral primary osteoarthritis, left knee: Secondary | ICD-10-CM | POA: Diagnosis not present

## 2018-03-04 DIAGNOSIS — F329 Major depressive disorder, single episode, unspecified: Secondary | ICD-10-CM | POA: Diagnosis not present

## 2018-03-10 DIAGNOSIS — M1712 Unilateral primary osteoarthritis, left knee: Secondary | ICD-10-CM | POA: Diagnosis not present

## 2018-03-11 DIAGNOSIS — R899 Unspecified abnormal finding in specimens from other organs, systems and tissues: Secondary | ICD-10-CM | POA: Diagnosis not present

## 2018-03-11 DIAGNOSIS — N183 Chronic kidney disease, stage 3 (moderate): Secondary | ICD-10-CM | POA: Diagnosis not present

## 2018-03-11 DIAGNOSIS — Z23 Encounter for immunization: Secondary | ICD-10-CM | POA: Diagnosis not present

## 2018-03-17 DIAGNOSIS — M170 Bilateral primary osteoarthritis of knee: Secondary | ICD-10-CM | POA: Diagnosis not present

## 2018-03-20 DIAGNOSIS — R062 Wheezing: Secondary | ICD-10-CM | POA: Diagnosis not present

## 2018-03-24 DIAGNOSIS — M1711 Unilateral primary osteoarthritis, right knee: Secondary | ICD-10-CM | POA: Diagnosis not present

## 2018-03-31 DIAGNOSIS — M1711 Unilateral primary osteoarthritis, right knee: Secondary | ICD-10-CM | POA: Diagnosis not present

## 2018-04-06 DIAGNOSIS — M79645 Pain in left finger(s): Secondary | ICD-10-CM | POA: Diagnosis not present

## 2018-04-06 DIAGNOSIS — M13842 Other specified arthritis, left hand: Secondary | ICD-10-CM | POA: Diagnosis not present

## 2018-05-17 ENCOUNTER — Telehealth: Payer: Self-pay | Admitting: Psychiatry

## 2018-05-17 NOTE — Telephone Encounter (Signed)
rx written will notify pt when ready to pick up

## 2018-05-17 NOTE — Telephone Encounter (Signed)
Need to check on pharmacy

## 2018-05-17 NOTE — Telephone Encounter (Signed)
Misplaced Concerta prescription. Please call in or will come by and pick up.

## 2018-05-17 NOTE — Telephone Encounter (Signed)
Pt notified to pick up rx.

## 2018-05-25 DIAGNOSIS — Z0000 Encounter for general adult medical examination without abnormal findings: Secondary | ICD-10-CM | POA: Diagnosis not present

## 2018-05-25 DIAGNOSIS — R7309 Other abnormal glucose: Secondary | ICD-10-CM | POA: Diagnosis not present

## 2018-05-28 DIAGNOSIS — Z0000 Encounter for general adult medical examination without abnormal findings: Secondary | ICD-10-CM | POA: Diagnosis not present

## 2018-06-11 ENCOUNTER — Other Ambulatory Visit: Payer: Self-pay | Admitting: Psychiatry

## 2018-06-11 NOTE — Telephone Encounter (Signed)
Need to review paper chart  

## 2018-06-14 ENCOUNTER — Telehealth: Payer: Self-pay | Admitting: Psychiatry

## 2018-06-14 ENCOUNTER — Other Ambulatory Visit: Payer: Self-pay | Admitting: Psychiatry

## 2018-06-14 DIAGNOSIS — F900 Attention-deficit hyperactivity disorder, predominantly inattentive type: Secondary | ICD-10-CM

## 2018-06-14 MED ORDER — METHYLPHENIDATE HCL ER 27 MG PO TB24: 27.0000 mg | ORAL_TABLET | Freq: Every day | ORAL | 0 refills | Status: DC

## 2018-06-14 NOTE — Telephone Encounter (Signed)
Pt. Called and said that she needs a refill of concerta 26 mg escribed to the [facility] on [address]

## 2018-06-14 NOTE — Progress Notes (Signed)
Patient seeking refill Concerta 27 mg.  3 refills given.  Patient due back in March.

## 2018-06-29 DIAGNOSIS — J019 Acute sinusitis, unspecified: Secondary | ICD-10-CM | POA: Diagnosis not present

## 2018-07-15 DIAGNOSIS — R309 Painful micturition, unspecified: Secondary | ICD-10-CM | POA: Diagnosis not present

## 2018-07-15 DIAGNOSIS — R300 Dysuria: Secondary | ICD-10-CM | POA: Diagnosis not present

## 2018-07-15 DIAGNOSIS — Z6837 Body mass index (BMI) 37.0-37.9, adult: Secondary | ICD-10-CM | POA: Diagnosis not present

## 2018-07-15 DIAGNOSIS — Z01411 Encounter for gynecological examination (general) (routine) with abnormal findings: Secondary | ICD-10-CM | POA: Diagnosis not present

## 2018-08-16 ENCOUNTER — Telehealth: Payer: Self-pay | Admitting: Psychiatry

## 2018-08-16 NOTE — Telephone Encounter (Signed)
Spoke with pharmacist and it was on file for a fill already

## 2018-08-16 NOTE — Telephone Encounter (Signed)
rx already on file at pharmacy for February. Check with pharmacy

## 2018-08-16 NOTE — Telephone Encounter (Signed)
Patient requesting refill of the Concerta. Fill at the [facility] on [address]. Appt Thursday. Chart in box.

## 2018-08-19 ENCOUNTER — Ambulatory Visit: Payer: BLUE CROSS/BLUE SHIELD | Admitting: Psychiatry

## 2018-08-19 ENCOUNTER — Encounter: Payer: Self-pay | Admitting: Psychiatry

## 2018-08-19 DIAGNOSIS — F900 Attention-deficit hyperactivity disorder, predominantly inattentive type: Secondary | ICD-10-CM

## 2018-08-19 DIAGNOSIS — F3342 Major depressive disorder, recurrent, in full remission: Secondary | ICD-10-CM

## 2018-08-19 MED ORDER — ARIPIPRAZOLE 10 MG PO TABS: 10.0000 mg | ORAL_TABLET | Freq: Every day | ORAL | 3 refills | Status: DC

## 2018-08-19 MED ORDER — VILAZODONE HCL 40 MG PO TABS: 40.0000 mg | ORAL_TABLET | Freq: Every morning | ORAL | 5 refills | Status: DC

## 2018-08-19 MED ORDER — METHYLPHENIDATE HCL ER 36 MG PO TB24: 36.0000 mg | ORAL_TABLET | Freq: Every day | ORAL | 0 refills | Status: DC

## 2018-08-19 MED ORDER — TEMAZEPAM 30 MG PO CAPS: 30.0000 mg | ORAL_CAPSULE | Freq: Every day | ORAL | 5 refills | Status: DC

## 2018-08-19 MED ORDER — HYDROXYZINE HCL 25 MG PO TABS: 25.0000 mg | ORAL_TABLET | Freq: Every day | ORAL | 1 refills | Status: DC

## 2018-08-19 NOTE — Progress Notes (Signed)
[name] [id number] 1956/02/11 63 y.o.  Subjective:   Patient ID:  Kristine Martinez is a 63 y.o. (DOB Mar 25, 1956) female.  Chief Complaint:  Chief Complaint  Patient presents with  . Follow-up    ADD, depression  . poor concentration    HPI [name] presents to the office today for follow-up of ADD and depression.  Cannot concentrate well or finish a tasks.  Wants med changes to help.  Patient reports stable mood and denies depressed or irritable moods.  Patient denies any recent difficulty with anxiety.  Patient denies difficulty with sleep initiation or maintenance but recent nightmares being chased.  Has awoken screaming.  This is unusual.  Been watching murder mysteries. Denies appetite disturbance.  Patient reports that energy and motivation have been good.  Patient denies any difficulty with concentration.  Patient denies any suicidal ideation.    Review of Systems:  Review of Systems  Neurological: Negative for tremors and weakness.  Psychiatric/Behavioral: Positive for decreased concentration and sleep disturbance. Negative for agitation, behavioral problems, confusion, dysphoric mood, hallucinations, self-injury and suicidal ideas. The patient is not nervous/anxious and is not hyperactive.   Normal BP  Medications: I have reviewed the patient's current medications.  Current Outpatient Medications  Medication Sig Dispense Refill  . ARIPiprazole (ABILIFY) 15 MG tablet TAKE 1 TABLET BY MOUTH ONCE DAILY (Patient taking differently: Take 3.75 mg by mouth daily. ) 90 tablet 0  . Calcium Citrate (CITRACAL PO) Take 2 tablets by mouth daily.    . ERGOCALCIFEROL PO Take 1 capsule by mouth daily.    . hydrOXYzine (ATARAX/VISTARIL) 25 MG tablet TAKE 1 TABLET BY MOUTH AT BEDTIME 90 tablet 1  . levothyroxine (SYNTHROID, LEVOTHROID) 75 MCG tablet Take 75 mcg by mouth daily before breakfast.    . methylphenidate 36 MG PO CR tablet Take 1 tablet (36 mg total) by mouth daily. 30 tablet  0  . [START ON 09/16/2018] methylphenidate 36 MG PO CR tablet Take 1 tablet (36 mg total) by mouth daily. 30 tablet 0  . [START ON 10/14/2018] methylphenidate 36 MG PO CR tablet Take 1 tablet (36 mg total) by mouth daily. 30 tablet 0  . minocycline (MINOCIN,DYNACIN) 50 MG capsule Take 50 mg by mouth 2 (two) times daily.    Marland Kitchen omeprazole (PRILOSEC OTC) 20 MG tablet Take 20 mg by mouth daily.    . temazepam (RESTORIL) 30 MG capsule Take 30 mg by mouth at bedtime.    . Vilazodone HCl (VIIBRYD) 40 MG TABS Take 40 mg by mouth every morning.     No current facility-administered medications for this visit.     Medication Side Effects: None  Allergies: No Known Allergies  History reviewed. No pertinent past medical history.  History reviewed. No pertinent family history.  Social History   Socioeconomic History  . Marital status: Married    Spouse name: Not on file  . Number of children: Not on file  . Years of education: Not on file  . Highest education level: Not on file  Occupational History  . Not on file  Social Needs  . Financial resource strain: Not on file  . Food insecurity:    Worry: Not on file    Inability: Not on file  . Transportation needs:    Medical: Not on file    Non-medical: Not on file  Tobacco Use  . Smoking status: Not on file  Substance and Sexual Activity  . Alcohol use: Not on file  .  Drug use: Not on file  . Sexual activity: Not on file  Lifestyle  . Physical activity:    Days per week: Not on file    Minutes per session: Not on file  . Stress: Not on file  Relationships  . Social connections:    Talks on phone: Not on file    Gets together: Not on file    Attends religious service: Not on file    Active member of club or organization: Not on file    Attends meetings of clubs or organizations: Not on file    Relationship status: Not on file  . Intimate partner violence:    Fear of current or ex partner: Not on file    Emotionally abused: Not on  file    Physically abused: Not on file    Forced sexual activity: Not on file  Other Topics Concern  . Not on file  Social History Narrative  . Not on file    Past Medical History, Surgical history, Social history, and Family history were reviewed and updated as appropriate.   Please see review of systems for further details on the patient's review from today.   Objective:   Physical Exam:  There were no vitals taken for this visit.  Physical Exam Constitutional:      General: She is not in acute distress.    Appearance: She is well-developed.  Musculoskeletal:        General: No deformity.  Neurological:     Mental Status: She is alert and oriented to person, place, and time.     Motor: No tremor.     Coordination: Coordination normal.     Gait: Gait normal.  Psychiatric:        Attention and Perception: Attention normal. She is attentive.        Mood and Affect: Mood normal. Mood is not anxious or depressed. Affect is not labile, blunt, angry or inappropriate.        Speech: Speech normal.        Behavior: Behavior normal.        Thought Content: Thought content normal. Thought content does not include homicidal or suicidal ideation. Thought content does not include homicidal or suicidal plan.        Cognition and Memory: Cognition normal.        Judgment: Judgment normal.     Comments: Insight is good.     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 Tolerating it. Discussed potential benefits, risks, and side effects of stimulants with patient to include increased heart rate, palpitations, insomnia, increased  anxiety, increased irritability, or decreased appetite.  Instructed patient to contact office if experiencing any significant tolerability issues.  Good response on mood but wants to make it easier to take Abilify.  Will adjust to 1/2 of 10 mg tablet.  Tendency towards anxiety and this may help.  Fu 6 mos or sooner if poor response.  Lynder Parents, MD, DFAPA    Please see After Visit Summary for patient specific instructions.  No future appointments.  No orders of the defined types were placed in this encounter.     -------------------------------

## 2018-09-06 ENCOUNTER — Other Ambulatory Visit: Payer: Self-pay | Admitting: Psychiatry

## 2018-09-06 MED ORDER — ARIPIPRAZOLE 10 MG PO TABS: 10.0000 mg | ORAL_TABLET | Freq: Every day | ORAL | 3 refills | Status: DC

## 2018-09-06 NOTE — Telephone Encounter (Signed)
Patient taking one half of a 10 mg tablet in order to keep from having to Corder 15 mg tablets

## 2018-09-06 NOTE — Telephone Encounter (Signed)
Not sure of her correct dosage? 10mg  or 15mg 

## 2018-09-06 NOTE — Telephone Encounter (Signed)
I sent in prescription that says 110 mg tablet daily #30

## 2018-09-06 NOTE — Telephone Encounter (Signed)
Correction sent in prescription for Abilify 10 mg 1 daily with refills

## 2018-09-16 DIAGNOSIS — H5789 Other specified disorders of eye and adnexa: Secondary | ICD-10-CM | POA: Diagnosis not present

## 2018-11-17 ENCOUNTER — Telehealth: Payer: Self-pay | Admitting: Psychiatry

## 2018-11-17 ENCOUNTER — Other Ambulatory Visit: Payer: Self-pay

## 2018-11-17 DIAGNOSIS — F900 Attention-deficit hyperactivity disorder, predominantly inattentive type: Secondary | ICD-10-CM

## 2018-11-17 MED ORDER — METHYLPHENIDATE HCL ER 36 MG PO TB24: 36.0000 mg | ORAL_TABLET | Freq: Every day | ORAL | 0 refills | Status: DC

## 2018-11-17 NOTE — Telephone Encounter (Signed)
[facility] called saying pt thought they had Concerta Rf on file there to fill. I don't see it. Please send next Concerta RF to [facility], [address].

## 2018-11-17 NOTE — Telephone Encounter (Signed)
Pended for approval.

## 2018-11-29 DIAGNOSIS — E039 Hypothyroidism, unspecified: Secondary | ICD-10-CM | POA: Diagnosis not present

## 2018-11-29 DIAGNOSIS — E559 Vitamin D deficiency, unspecified: Secondary | ICD-10-CM | POA: Diagnosis not present

## 2018-11-29 DIAGNOSIS — E538 Deficiency of other specified B group vitamins: Secondary | ICD-10-CM | POA: Diagnosis not present

## 2018-11-29 DIAGNOSIS — K7581 Nonalcoholic steatohepatitis (NASH): Secondary | ICD-10-CM | POA: Diagnosis not present

## 2018-12-03 ENCOUNTER — Other Ambulatory Visit: Payer: Self-pay | Admitting: Psychiatry

## 2018-12-27 DIAGNOSIS — Z1159 Encounter for screening for other viral diseases: Secondary | ICD-10-CM | POA: Diagnosis not present

## 2019-01-02 ENCOUNTER — Other Ambulatory Visit: Payer: Self-pay | Admitting: Psychiatry

## 2019-02-06 ENCOUNTER — Other Ambulatory Visit: Payer: Self-pay | Admitting: Psychiatry

## 2019-02-14 ENCOUNTER — Other Ambulatory Visit: Payer: Self-pay | Admitting: Psychiatry

## 2019-02-17 ENCOUNTER — Other Ambulatory Visit: Payer: Self-pay

## 2019-02-17 ENCOUNTER — Encounter: Payer: Self-pay | Admitting: Psychiatry

## 2019-02-17 ENCOUNTER — Ambulatory Visit (INDEPENDENT_AMBULATORY_CARE_PROVIDER_SITE_OTHER): Payer: BC Managed Care – PPO | Admitting: Psychiatry

## 2019-02-17 DIAGNOSIS — F5105 Insomnia due to other mental disorder: Secondary | ICD-10-CM | POA: Diagnosis not present

## 2019-02-17 DIAGNOSIS — F900 Attention-deficit hyperactivity disorder, predominantly inattentive type: Secondary | ICD-10-CM | POA: Diagnosis not present

## 2019-02-17 DIAGNOSIS — F3342 Major depressive disorder, recurrent, in full remission: Secondary | ICD-10-CM

## 2019-02-17 MED ORDER — METHYLPHENIDATE HCL ER 54 MG PO TB24
36.0000 mg | ORAL_TABLET | Freq: Every day | ORAL | 0 refills | Status: DC
Start: 2019-02-17 — End: 2019-05-16

## 2019-02-17 MED ORDER — METHYLPHENIDATE HCL ER 54 MG PO TB24: 54.0000 mg | ORAL_TABLET | Freq: Every day | ORAL | 0 refills | Status: DC

## 2019-02-17 MED ORDER — VIIBRYD 40 MG PO TABS: 40.0000 mg | ORAL_TABLET | Freq: Every day | ORAL | 5 refills | Status: DC

## 2019-02-17 MED ORDER — TEMAZEPAM 30 MG PO CAPS: 30.0000 mg | ORAL_CAPSULE | Freq: Every day | ORAL | 5 refills | Status: DC

## 2019-02-17 NOTE — Progress Notes (Signed)
MAYRANI REIS [id number] [birth year] 63 y.o.  Subjective:   Patient ID:  Kristine Martinez is a 63 y.o. (DOB March 16, 1956) female.  Chief Complaint:  Chief Complaint  Patient presents with  . Follow-up    Medication Management  . Depression    Medication Management    Depression        Associated symptoms include decreased concentration.  Associated symptoms include no suicidal ideas.  Kristine Martinez presents to the office today for follow-up of ADD and depression.  Last seen August 19, 2018.  Because of concerns about attention and focus Concerta was increased to 36 mg every morning.  The other medications were continued without change. Doesn't get tasks done.  Can't make myself do it.  Denies feeling depressed despite stressors.  Enjoys gkids and reading. Trouble staying focused.  Wants to increase Concerta.  Increase didn't help as much as I needed. Still Cannot concentrate well or finish a tasks.  Wants med changes to help. I feel pretty chill compared to 20 years ago.  Patient reports stable mood and denies depressed or irritable moods.  Patient denies any recent difficulty with anxiety.  Patient denies difficulty with sleep initiation or maintenance but recent nightmares being chased still occur a couple times monthly.  Otherwise Restoril helps.  Has awoken screaming.  This is unusual.  Been watching murder mysteries. Denies appetite disturbance.  Patient reports that energy and motivation have been good.    Patient denies any suicidal ideation.  Past Psychiatric Medication Trials: Vyvanse, Concerta, Ambien side effects, trazodone, prazosin, hydroxyzine, Lunesta, Viibryd, duloxetine 90, Wellbutrin 450, fluoxetine, temazepam 30  Review of Systems:  Review of Systems  Neurological: Negative for tremors and weakness.  Psychiatric/Behavioral: Positive for decreased concentration, depression and sleep disturbance. Negative for agitation, behavioral problems, confusion, dysphoric mood,  hallucinations, self-injury and suicidal ideas. The patient is not nervous/anxious and is not hyperactive.   Normal BP  Medications: I have reviewed the patient's current medications.  Current Outpatient Medications  Medication Sig Dispense Refill  . ARIPiprazole (ABILIFY) 10 MG tablet TAKE 1 TABLET(10 MG) BY MOUTH DAILY 30 tablet 3  . Calcium Citrate (CITRACAL PO) Take 2 tablets by mouth daily.    . hydrOXYzine (ATARAX/VISTARIL) 25 MG tablet TAKE 1 TABLET(25 MG) BY MOUTH AT BEDTIME (Patient taking differently: Take 10 mg by mouth. ) 90 tablet 1  . methylphenidate 54 MG PO TB24 Take 36 mg by mouth daily. 30 tablet 0  . [START ON 03/17/2019] methylphenidate 54 MG PO TB24 Take 54 mg by mouth daily. 30 tablet 0  . [START ON 04/14/2019] methylphenidate 54 MG PO TB24 Take 54 mg by mouth daily. 30 tablet 0  . omeprazole (PRILOSEC OTC) 20 MG tablet Take 20 mg by mouth daily.    . temazepam (RESTORIL) 30 MG capsule Take 1 capsule (30 mg total) by mouth at bedtime. 30 capsule 5  . thyroid (ARMOUR THYROID) 60 MG tablet Armour Thyroid 60 mg tablet    . Vilazodone HCl (VIIBRYD) 40 MG TABS Take 1 tablet (40 mg total) by mouth daily. 30 tablet 5   No current facility-administered medications for this visit.     Medication Side Effects: None  Allergies: No Known Allergies  History reviewed. No pertinent past medical history.  History reviewed. No pertinent family history.  Social History   Socioeconomic History  . Marital status: Married    Spouse name: Not on file  . Number of children: Not on file  . Years of  education: Not on file  . Highest education level: Not on file  Occupational History  . Not on file  Social Needs  . Financial resource strain: Not on file  . Food insecurity    Worry: Not on file    Inability: Not on file  . Transportation needs    Medical: Not on file    Non-medical: Not on file  Tobacco Use  . Smoking status: Never Smoker  . Smokeless tobacco: Never Used   Substance and Sexual Activity  . Alcohol use: Not on file  . Drug use: Not on file  . Sexual activity: Not on file  Lifestyle  . Physical activity    Days per week: Not on file    Minutes per session: Not on file  . Stress: Not on file  Relationships  . Social Herbalist on phone: Not on file    Gets together: Not on file    Attends religious service: Not on file    Active member of club or organization: Not on file    Attends meetings of clubs or organizations: Not on file    Relationship status: Not on file  . Intimate partner violence    Fear of current or ex partner: Not on file    Emotionally abused: Not on file    Physically abused: Not on file    Forced sexual activity: Not on file  Other Topics Concern  . Not on file  Social History Narrative  . Not on file    Past Medical History, Surgical history, Social history, and Family history were reviewed and updated as appropriate.   Please see review of systems for further details on the patient's review from today.   Objective:   Physical Exam:  There were no vitals taken for this visit.  Physical Exam Constitutional:      General: She is not in acute distress.    Appearance: She is well-developed.  Musculoskeletal:        General: No deformity.  Neurological:     Mental Status: She is alert and oriented to person, place, and time.     Motor: No tremor.     Coordination: Coordination normal.     Gait: Gait normal.  Psychiatric:        Attention and Perception: She is inattentive.        Mood and Affect: Mood is not anxious or depressed. Affect is not labile, blunt, angry or inappropriate.        Speech: Speech normal.        Behavior: Behavior normal.        Thought Content: Thought content normal. Thought content does not include homicidal or suicidal ideation. Thought content does not include homicidal or suicidal plan.        Cognition and Memory: Cognition normal.        Judgment: Judgment  normal.     Comments: Insight is good.     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  She doesn't believe she's depressed.  Therefore increase the Concerta 54 mg daily. Tolerating last increase. . Discussed potential benefits, risks, and side effects of stimulants with patient to include increased heart rate, palpitations, insomnia, increased anxiety, increased irritability, or decreased appetite.  Instructed patient to contact office if experiencing any significant tolerability issues.  Good depression and anxiety response with Viibryd 40 + Abilify 5.  No SE  Tendency towards anxiety and this may help.  Sleep good overall with occ NM.  Not enough to restart NM meds.  Greater than 50% of 30 min face to face time with patient was spent on counseling and coordination of care.   Fu 50mos or sooner if poor response. To FU med change  Lynder Parents, MD, DFAPA    Please see After Visit Summary for patient specific  instructions.  No future appointments.  No orders of the defined types were placed in this encounter.     -------------------------------

## 2019-02-20 ENCOUNTER — Other Ambulatory Visit: Payer: Self-pay | Admitting: Psychiatry

## 2019-02-20 DIAGNOSIS — F5105 Insomnia due to other mental disorder: Secondary | ICD-10-CM

## 2019-03-01 DIAGNOSIS — E669 Obesity, unspecified: Secondary | ICD-10-CM | POA: Diagnosis not present

## 2019-03-01 DIAGNOSIS — R51 Headache: Secondary | ICD-10-CM | POA: Diagnosis not present

## 2019-03-01 DIAGNOSIS — K7581 Nonalcoholic steatohepatitis (NASH): Secondary | ICD-10-CM | POA: Diagnosis not present

## 2019-03-01 DIAGNOSIS — R7303 Prediabetes: Secondary | ICD-10-CM | POA: Diagnosis not present

## 2019-03-02 DIAGNOSIS — K7581 Nonalcoholic steatohepatitis (NASH): Secondary | ICD-10-CM | POA: Diagnosis not present

## 2019-03-02 DIAGNOSIS — Z23 Encounter for immunization: Secondary | ICD-10-CM | POA: Diagnosis not present

## 2019-03-02 DIAGNOSIS — R7303 Prediabetes: Secondary | ICD-10-CM | POA: Diagnosis not present

## 2019-03-02 DIAGNOSIS — E538 Deficiency of other specified B group vitamins: Secondary | ICD-10-CM | POA: Diagnosis not present

## 2019-03-21 ENCOUNTER — Telehealth: Payer: Self-pay | Admitting: Psychiatry

## 2019-03-21 NOTE — Telephone Encounter (Signed)
Pt refill request for Concerta 54mg  @ [facility] on file

## 2019-03-21 NOTE — Telephone Encounter (Signed)
Patient already has refills on file for Concerta at her pharmacy for Oct and Nov.

## 2019-03-31 DIAGNOSIS — M25561 Pain in right knee: Secondary | ICD-10-CM | POA: Diagnosis not present

## 2019-03-31 DIAGNOSIS — M25562 Pain in left knee: Secondary | ICD-10-CM | POA: Diagnosis not present

## 2019-03-31 DIAGNOSIS — M1712 Unilateral primary osteoarthritis, left knee: Secondary | ICD-10-CM | POA: Diagnosis not present

## 2019-04-08 DIAGNOSIS — M1712 Unilateral primary osteoarthritis, left knee: Secondary | ICD-10-CM | POA: Diagnosis not present

## 2019-04-13 DIAGNOSIS — M1712 Unilateral primary osteoarthritis, left knee: Secondary | ICD-10-CM | POA: Diagnosis not present

## 2019-04-19 ENCOUNTER — Telehealth: Payer: Self-pay | Admitting: Psychiatry

## 2019-04-19 NOTE — Telephone Encounter (Signed)
Pt called to request refill on Concerta 54 mg  (generic) @ [facility]  on [address]

## 2019-04-20 NOTE — Telephone Encounter (Signed)
Spoke with pharmacy and they stated that she did have 1 on file from September and they will go ahead and fill it.

## 2019-04-21 DIAGNOSIS — E559 Vitamin D deficiency, unspecified: Secondary | ICD-10-CM | POA: Diagnosis not present

## 2019-04-21 DIAGNOSIS — K219 Gastro-esophageal reflux disease without esophagitis: Secondary | ICD-10-CM | POA: Diagnosis not present

## 2019-04-21 DIAGNOSIS — N1832 Chronic kidney disease, stage 3b: Secondary | ICD-10-CM | POA: Diagnosis not present

## 2019-04-21 DIAGNOSIS — K7581 Nonalcoholic steatohepatitis (NASH): Secondary | ICD-10-CM | POA: Diagnosis not present

## 2019-04-25 DIAGNOSIS — N644 Mastodynia: Secondary | ICD-10-CM | POA: Diagnosis not present

## 2019-05-02 DIAGNOSIS — N6459 Other signs and symptoms in breast: Secondary | ICD-10-CM | POA: Diagnosis not present

## 2019-05-02 DIAGNOSIS — N644 Mastodynia: Secondary | ICD-10-CM | POA: Diagnosis not present

## 2019-05-02 DIAGNOSIS — R928 Other abnormal and inconclusive findings on diagnostic imaging of breast: Secondary | ICD-10-CM | POA: Diagnosis not present

## 2019-05-16 ENCOUNTER — Other Ambulatory Visit: Payer: Self-pay

## 2019-05-16 ENCOUNTER — Telehealth: Payer: Self-pay | Admitting: Psychiatry

## 2019-05-16 ENCOUNTER — Encounter: Payer: BC Managed Care – PPO | Admitting: Psychiatry

## 2019-05-16 ENCOUNTER — Encounter: Payer: Self-pay | Admitting: Psychiatry

## 2019-05-16 DIAGNOSIS — F900 Attention-deficit hyperactivity disorder, predominantly inattentive type: Secondary | ICD-10-CM

## 2019-05-16 MED ORDER — METHYLPHENIDATE HCL ER 54 MG PO TB24: 36.0000 mg | ORAL_TABLET | Freq: Every day | ORAL | 0 refills | Status: DC

## 2019-05-16 NOTE — Telephone Encounter (Signed)
Kristine Martinez had to RS her appt this morning because Dr. Clovis Pu was late.  She needs a refill of her concerta.  Please sent to the [facility] on [address].  Going to [place] so needs to get it before she leaves.

## 2019-05-16 NOTE — Telephone Encounter (Signed)
Last refill 04/14/2019 Pended for approval

## 2019-05-17 ENCOUNTER — Other Ambulatory Visit: Payer: Self-pay

## 2019-05-17 DIAGNOSIS — F900 Attention-deficit hyperactivity disorder, predominantly inattentive type: Secondary | ICD-10-CM

## 2019-05-17 MED ORDER — METHYLPHENIDATE HCL ER 54 MG PO TB24: 54.0000 mg | ORAL_TABLET | Freq: Every day | ORAL | 0 refills | Status: DC

## 2019-05-26 DIAGNOSIS — M1712 Unilateral primary osteoarthritis, left knee: Secondary | ICD-10-CM | POA: Diagnosis not present

## 2019-05-27 ENCOUNTER — Other Ambulatory Visit: Payer: Self-pay | Admitting: Family Medicine

## 2019-06-02 DIAGNOSIS — M1811 Unilateral primary osteoarthritis of first carpometacarpal joint, right hand: Secondary | ICD-10-CM | POA: Diagnosis not present

## 2019-06-02 DIAGNOSIS — M1711 Unilateral primary osteoarthritis, right knee: Secondary | ICD-10-CM | POA: Diagnosis not present

## 2019-06-09 DIAGNOSIS — M1711 Unilateral primary osteoarthritis, right knee: Secondary | ICD-10-CM | POA: Diagnosis not present

## 2019-06-22 ENCOUNTER — Other Ambulatory Visit: Payer: Self-pay

## 2019-06-22 ENCOUNTER — Ambulatory Visit (INDEPENDENT_AMBULATORY_CARE_PROVIDER_SITE_OTHER): Payer: BC Managed Care – PPO | Admitting: Psychiatry

## 2019-06-22 ENCOUNTER — Encounter: Payer: Self-pay | Admitting: Psychiatry

## 2019-06-22 VITALS — BP 139/80 | HR 89

## 2019-06-22 DIAGNOSIS — F5105 Insomnia due to other mental disorder: Secondary | ICD-10-CM

## 2019-06-22 DIAGNOSIS — F3342 Major depressive disorder, recurrent, in full remission: Secondary | ICD-10-CM

## 2019-06-22 DIAGNOSIS — F900 Attention-deficit hyperactivity disorder, predominantly inattentive type: Secondary | ICD-10-CM | POA: Diagnosis not present

## 2019-06-22 MED ORDER — TEMAZEPAM 30 MG PO CAPS: 30.0000 mg | ORAL_CAPSULE | Freq: Every day | ORAL | 1 refills | Status: DC

## 2019-06-22 MED ORDER — VIIBRYD 40 MG PO TABS: 40.0000 mg | ORAL_TABLET | Freq: Every day | ORAL | 5 refills | Status: DC

## 2019-06-22 MED ORDER — METHYLPHENIDATE HCL ER (OSM) 36 MG PO TBCR: 72.0000 mg | EXTENDED_RELEASE_TABLET | Freq: Every day | ORAL | 0 refills | Status: DC

## 2019-06-22 MED ORDER — ARIPIPRAZOLE 5 MG PO TABS: 5.0000 mg | ORAL_TABLET | Freq: Every day | ORAL | 1 refills | Status: DC

## 2019-06-22 NOTE — Progress Notes (Signed)
IRERI NEWBROUGH [id number] May 24, 1956 64 y.o.  Subjective:   Patient ID:  Kristine Martinez is a 64 y.o. (DOB 09/27/1955) female.  Chief Complaint:  Chief Complaint  Patient presents with  . Follow-up    Medication Management  . Depression    Medication Management  . Medication Refill    Concerta    Depression        Associated symptoms include decreased concentration.  Associated symptoms include no suicidal ideas.  Kristine Martinez presents to the office today for follow-up of ADD and depression.   seen August 19, 2018.  Because of concerns about attention and focus Concerta was increased to 36 mg every morning.  The other medications were continued without change. Doesn't get tasks done.  Can't make myself do it.  Denies feeling depressed despite stressors.  Enjoys gkids and reading. Trouble staying focused.  Wants to increase Concerta.  Last seen March 16, 2019.  She wanted to increase the Concerta therefore was increased to 54 mg daily.  The other meds were left unchanged. Generally Good depression and anxiety response with Viibryd 40 + Abilify 5.  No SE  Seems OK but limited noticeable effects.  No SE.  Px as noted below remain.  Increase didn't help as much as I needed. Still Cannot concentrate well or finish a tasks.  Wants med changes to help. I feel pretty chill compared to 20 years ago.  Patient reports stable mood and denies depressed or irritable moods.  Patient denies any recent difficulty with anxiety.  Patient denies difficulty with sleep initiation or maintenance but recent anniversary nightmares.  Overall fewer.  7-8 hours.  Otherwise Restoril helps.  Has awoken screaming.  This is unusual.  Been watching murder mysteries. Denies appetite disturbance.  Patient reports that energy and motivation have been good.    Patient denies any suicidal ideation.  Past Psychiatric Medication Trials: Vyvanse, Concerta, Ambien side effects, trazodone, prazosin, hydroxyzine, Lunesta, Viibryd,  duloxetine 90, Wellbutrin 450, fluoxetine, temazepam 30  Review of Systems:  Review of Systems  Cardiovascular: Negative for palpitations.  Neurological: Negative for tremors and weakness.  Psychiatric/Behavioral: Positive for decreased concentration, depression and sleep disturbance. Negative for agitation, behavioral problems, confusion, dysphoric mood, hallucinations, self-injury and suicidal ideas. The patient is not nervous/anxious and is not hyperactive.   Normal BP  Medications: I have reviewed the patient's current medications.  Current Outpatient Medications  Medication Sig Dispense Refill  . ARIPiprazole (ABILIFY) 5 MG tablet Take 1 tablet (5 mg total) by mouth daily. 90 tablet 1  . hydrOXYzine (ATARAX/VISTARIL) 25 MG tablet TAKE 1 TABLET(25 MG) BY MOUTH AT BEDTIME (Patient taking differently: Take 25 mg by mouth. ) 90 tablet 1  . omeprazole (PRILOSEC OTC) 20 MG tablet Take 20 mg by mouth daily.    . temazepam (RESTORIL) 30 MG capsule Take 1 capsule (30 mg total) by mouth at bedtime. 90 capsule 1  . thyroid (ARMOUR THYROID) 60 MG tablet Armour Thyroid 60 mg tablet    . Vilazodone HCl (VIIBRYD) 40 MG TABS Take 1 tablet (40 mg total) by mouth daily. 30 tablet 5  . [START ON 08/17/2019] methylphenidate (CONCERTA) 36 MG PO CR tablet Take 2 tablets (72 mg total) by mouth daily. 60 tablet 0  . [START ON 07/20/2019] methylphenidate (CONCERTA) 36 MG PO CR tablet Take 2 tablets (72 mg total) by mouth daily. 60 tablet 0  . methylphenidate (CONCERTA) 36 MG PO CR tablet Take 2 tablets (72 mg total) by mouth  daily. 60 tablet 0   No current facility-administered medications for this visit.    Medication Side Effects: None  Allergies: No Known Allergies  History reviewed. No pertinent past medical history.  History reviewed. No pertinent family history.  Social History   Socioeconomic History  . Marital status: Married    Spouse name: Not on file  . Number of children: Not on file  .  Years of education: Not on file  . Highest education level: Not on file  Occupational History  . Not on file  Tobacco Use  . Smoking status: Never Smoker  . Smokeless tobacco: Never Used  Substance and Sexual Activity  . Alcohol use: Not on file  . Drug use: Not on file  . Sexual activity: Not on file  Other Topics Concern  . Not on file  Social History Narrative  . Not on file   Social Determinants of Health   Financial Resource Strain:   . Difficulty of Paying Living Expenses: Not on file  Food Insecurity:   . Worried About Charity fundraiser in the Last Year: Not on file  . Ran Out of Food in the Last Year: Not on file  Transportation Needs:   . Lack of Transportation (Medical): Not on file  . Lack of Transportation (Non-Medical): Not on file  Physical Activity:   . Days of Exercise per Week: Not on file  . Minutes of Exercise per Session: Not on file  Stress:   . Feeling of Stress : Not on file  Social Connections:   . Frequency of Communication with Friends and Family: Not on file  . Frequency of Social Gatherings with Friends and Family: Not on file  . Attends Religious Services: Not on file  . Active Member of Clubs or Organizations: Not on file  . Attends Archivist Meetings: Not on file  . Marital Status: Not on file  Intimate Partner Violence:   . Fear of Current or Ex-Partner: Not on file  . Emotionally Abused: Not on file  . Physically Abused: Not on file  . Sexually Abused: Not on file    Past Medical History, Surgical history, Social history, and Family history were reviewed and updated as appropriate.   Please see review of systems for further details on the patient's review from today.   Objective:   Physical Exam:  BP 139/80   Pulse 89   Physical Exam Constitutional:      General: She is not in acute distress.    Appearance: She is well-developed.  Musculoskeletal:        General: No deformity.  Neurological:     Mental Status:  She is alert and oriented to person, place, and time.     Motor: No tremor.     Coordination: Coordination normal.     Gait: Gait normal.  Psychiatric:        Attention and Perception: She is inattentive.        Mood and Affect: Mood is not anxious or depressed. Affect is not labile, blunt, flat, angry, tearful or inappropriate.        Speech: Speech normal.        Behavior: Behavior normal.        Thought Content: Thought content normal. Thought content does not include homicidal or suicidal ideation. Thought content does not include homicidal or suicidal plan.        Cognition and Memory: Cognition normal.  Judgment: Judgment normal.     Comments: Insight is good.     Lab Review:  No results found for: NA, K, CL, CO2, GLUCOSE, BUN, CREATININE, CALCIUM, PROT, ALBUMIN, AST, ALT, ALKPHOS, BILITOT, GFRNONAA, GFRAA     Component Value Date/Time   WBC 6.2 01/16/2014 0843   RBC 4.74 01/16/2014 0843   HGB 13.2 01/16/2014 0843   HCT 38.9 01/16/2014 0843   PLT 212 01/16/2014 0843   MCV 82.1 01/16/2014 0843   MCH 27.8 01/16/2014 0843   MCHC 33.9 01/16/2014 0843   RDW 15.5 01/16/2014 0843    No results found for: POCLITH, LITHIUM   No results found for: PHENYTOIN, PHENOBARB, VALPROATE, CBMZ   .res Assessment: Plan:    Attention deficit hyperactivity disorder (ADHD), predominantly inattentive type - Plan: methylphenidate (CONCERTA) 36 MG PO CR tablet, methylphenidate (CONCERTA) 36 MG PO CR tablet, methylphenidate (CONCERTA) 36 MG PO CR tablet  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 We discussed Educated difference between anhedonic depression with poor attention and ADHD and overlap of sx in detail.   She doesn't believe she's depressed.  Therefore increase the Concerta 72 mg daily. Tolerating last increase. BP and pulse were OK. . Discussed potential benefits, risks, and side effects of stimulants with patient to include increased heart rate, palpitations, insomnia, increased anxiety, increased irritability, or decreased appetite.  Instructed patient to contact office if experiencing any significant tolerability issues.  Good depression and anxiety response with Viibryd 40 + Abilify 5.  No SE  Tendency towards anxiety much better.  Sleep good overall with occ NM.  Not enough to restart NM meds. Hydroxyzine helps but still needs temazepam.  Fu 34mos or sooner if poor response. To FU med change  Lynder Parents, MD, DFAPA    Please see After Visit Summary for patient specific instructions.  No future appointments.  No orders of the defined types were placed in this encounter.     -------------------------------

## 2019-08-08 ENCOUNTER — Other Ambulatory Visit: Payer: Self-pay | Admitting: Psychiatry

## 2019-08-15 ENCOUNTER — Other Ambulatory Visit: Payer: Self-pay | Admitting: Psychiatry

## 2019-08-15 DIAGNOSIS — F5105 Insomnia due to other mental disorder: Secondary | ICD-10-CM

## 2019-09-15 ENCOUNTER — Telehealth: Payer: Self-pay | Admitting: Psychiatry

## 2019-09-15 ENCOUNTER — Other Ambulatory Visit: Payer: Self-pay

## 2019-09-15 DIAGNOSIS — F900 Attention-deficit hyperactivity disorder, predominantly inattentive type: Secondary | ICD-10-CM

## 2019-09-15 MED ORDER — METHYLPHENIDATE HCL ER (OSM) 36 MG PO TBCR: 72.0000 mg | EXTENDED_RELEASE_TABLET | Freq: Every day | ORAL | 0 refills | Status: DC

## 2019-09-15 NOTE — Telephone Encounter (Signed)
Patient called and left a message about needing a refill on her concerta 36 mg to be [facility] to [facility] [address]. She has an appointment on 4/5

## 2019-09-15 NOTE — Telephone Encounter (Signed)
Last refill 08/19/2019, pended for Kristine Martinez to submit Has apt 09/26/2019 with Dr. Clovis Pu

## 2019-09-21 ENCOUNTER — Ambulatory Visit: Payer: BC Managed Care – PPO | Admitting: Psychiatry

## 2019-09-26 ENCOUNTER — Other Ambulatory Visit: Payer: Self-pay

## 2019-09-26 ENCOUNTER — Encounter: Payer: Self-pay | Admitting: Psychiatry

## 2019-09-26 ENCOUNTER — Ambulatory Visit (INDEPENDENT_AMBULATORY_CARE_PROVIDER_SITE_OTHER): Payer: BC Managed Care – PPO | Admitting: Psychiatry

## 2019-09-26 DIAGNOSIS — F900 Attention-deficit hyperactivity disorder, predominantly inattentive type: Secondary | ICD-10-CM | POA: Diagnosis not present

## 2019-09-26 DIAGNOSIS — F5105 Insomnia due to other mental disorder: Secondary | ICD-10-CM

## 2019-09-26 DIAGNOSIS — F3342 Major depressive disorder, recurrent, in full remission: Secondary | ICD-10-CM | POA: Diagnosis not present

## 2019-09-26 MED ORDER — METHYLPHENIDATE HCL ER (OSM) 36 MG PO TBCR: 72.0000 mg | EXTENDED_RELEASE_TABLET | Freq: Every day | ORAL | 0 refills | Status: DC

## 2019-09-26 NOTE — Progress Notes (Signed)
Kristine Martinez [id number] May 04, 1956 64 y.o.  Subjective:   Patient ID:  Kristine Martinez is a 64 y.o. (DOB 1955-07-01) female.  Chief Complaint:  Chief Complaint  Patient presents with  . ADHD  . Follow-up    med changes and mood and anxiety    Depression        Associated symptoms include decreased concentration.  Associated symptoms include no suicidal ideas.  Kristine Martinez presents to the office today for follow-up of ADD and depression.   seen August 19, 2018.  Because of concerns about attention and focus Concerta was increased to 36 mg every morning.  The other medications were continued without change. Doesn't get tasks done.  Can't make myself do it.  Denies feeling depressed despite stressors.  Enjoys gkids and reading. Trouble staying focused.  Wants to increase Concerta.  seen March 16, 2019.  She wanted to increase the Concerta therefore was increased to 54 mg daily.  The other meds were left unchanged. Generally Good depression and anxiety response with Viibryd 40 + Abilify 5.  No SE  Last seen June 22, 2019.  She was doing okay with regard to mood and anxiety but did not notice sufficient benefit with the increase in Concerta to 54 mg daily.  Therefore the dosage was increased to 72 mg daily.  Increase in Concerta noticed a little change but not as much as she'd like to be with productivity.  It's a little better.  GKids staying with her with son temporarily.    Patient reports stable mood and denies depressed or irritable moods.  Patient denies any recent difficulty with anxiety.  Patient denies difficulty with sleep initiation or maintenance but recent anniversary nightmares.  Overall fewer.  7-8 hours.  Otherwise Restoril helps.  Has awoken screaming.  This is unusual.  Been watching murder mysteries. Denies appetite disturbance.  Patient reports that energy and motivation have been good.    Patient denies any suicidal ideation.  Past Psychiatric Medication Trials: Vyvanse,  Concerta 72,  Ambien side effects, trazodone, prazosin, hydroxyzine, Lunesta, Viibryd, duloxetine 90, Wellbutrin 450, fluoxetine, temazepam 30  Review of Systems:  Review of Systems  Cardiovascular: Negative for chest pain and palpitations.  Neurological: Negative for tremors and weakness.  Psychiatric/Behavioral: Positive for decreased concentration, depression and sleep disturbance. Negative for agitation, behavioral problems, confusion, dysphoric mood, hallucinations, self-injury and suicidal ideas. The patient is not nervous/anxious and is not hyperactive.   Normal BP  Medications: I have reviewed the patient's current medications.  Current Outpatient Medications  Medication Sig Dispense Refill  . ARIPiprazole (ABILIFY) 5 MG tablet Take 1 tablet (5 mg total) by mouth daily. 90 tablet 1  . hydrOXYzine (ATARAX/VISTARIL) 25 MG tablet Take 1 tablet (25 mg total) by mouth at bedtime as needed. 90 tablet 0  . methylphenidate (CONCERTA) 36 MG PO CR tablet Take 2 tablets (72 mg total) by mouth daily. 60 tablet 0  . [START ON 10/24/2019] methylphenidate (CONCERTA) 36 MG PO CR tablet Take 2 tablets (72 mg total) by mouth daily. 60 tablet 0  . [START ON 11/21/2019] methylphenidate (CONCERTA) 36 MG PO CR tablet Take 2 tablets (72 mg total) by mouth daily. 60 tablet 0  . omeprazole (PRILOSEC OTC) 20 MG tablet Take 20 mg by mouth daily.    . temazepam (RESTORIL) 30 MG capsule TAKE 1 CAPSULE(30 MG) BY MOUTH AT BEDTIME 30 capsule 2  . thyroid (ARMOUR THYROID) 60 MG tablet Armour Thyroid 60 mg tablet    .  Vilazodone HCl (VIIBRYD) 40 MG TABS Take 1 tablet (40 mg total) by mouth daily. 30 tablet 5   No current facility-administered medications for this visit.    Medication Side Effects: None  Allergies: No Known Allergies  History reviewed. No pertinent past medical history.  History reviewed. No pertinent family history.  Social History   Socioeconomic History  . Marital status: Married     Spouse name: Not on file  . Number of children: Not on file  . Years of education: Not on file  . Highest education level: Not on file  Occupational History  . Not on file  Tobacco Use  . Smoking status: Never Smoker  . Smokeless tobacco: Never Used  Substance and Sexual Activity  . Alcohol use: Not on file  . Drug use: Not on file  . Sexual activity: Not on file  Other Topics Concern  . Not on file  Social History Narrative  . Not on file   Social Determinants of Health   Financial Resource Strain:   . Difficulty of Paying Living Expenses:   Food Insecurity:   . Worried About Charity fundraiser in the Last Year:   . Arboriculturist in the Last Year:   Transportation Needs:   . Film/video editor (Medical):   Marland Kitchen Lack of Transportation (Non-Medical):   Physical Activity:   . Days of Exercise per Week:   . Minutes of Exercise per Session:   Stress:   . Feeling of Stress :   Social Connections:   . Frequency of Communication with Friends and Family:   . Frequency of Social Gatherings with Friends and Family:   . Attends Religious Services:   . Active Member of Clubs or Organizations:   . Attends Archivist Meetings:   Marland Kitchen Marital Status:   Intimate Partner Violence:   . Fear of Current or Ex-Partner:   . Emotionally Abused:   Marland Kitchen Physically Abused:   . Sexually Abused:     Past Medical History, Surgical history, Social history, and Family history were reviewed and updated as appropriate.   Please see review of systems for further details on the patient's review from today.   Objective:   Physical Exam:  There were no vitals taken for this visit.  Physical Exam Constitutional:      General: She is not in acute distress.    Appearance: She is well-developed.  Musculoskeletal:        General: No deformity.  Neurological:     Mental Status: She is alert and oriented to person, place, and time.     Motor: No tremor.     Coordination: Coordination  normal.     Gait: Gait normal.  Psychiatric:        Attention and Perception: She is inattentive.        Mood and Affect: Mood is not anxious or depressed. Affect is not labile, blunt, flat, angry, tearful or inappropriate.        Speech: Speech normal.        Behavior: Behavior normal.        Thought Content: Thought content normal. Thought content does not include homicidal or suicidal ideation. Thought content does not include homicidal or suicidal plan.        Cognition and Memory: Cognition normal.        Judgment: Judgment normal.     Comments: Insight is good.     Lab Review:  No results found  for: NA, K, CL, CO2, GLUCOSE, BUN, CREATININE, CALCIUM, PROT, ALBUMIN, AST, ALT, ALKPHOS, BILITOT, GFRNONAA, GFRAA     Component Value Date/Time   WBC 6.2 01/16/2014 0843   RBC 4.74 01/16/2014 0843   HGB 13.2 01/16/2014 0843   HCT 38.9 01/16/2014 0843   PLT 212 01/16/2014 0843   MCV 82.1 01/16/2014 0843   MCH 27.8 01/16/2014 0843   MCHC 33.9 01/16/2014 0843   RDW 15.5 01/16/2014 0843    No results found for: POCLITH, LITHIUM   No results found for: PHENYTOIN, PHENOBARB, VALPROATE, CBMZ   .res Assessment: Plan:    Attention deficit hyperactivity disorder (ADHD), predominantly inattentive type - Plan: methylphenidate (CONCERTA) 36 MG PO CR tablet, methylphenidate (CONCERTA) 36 MG PO CR tablet, methylphenidate (CONCERTA) 36 MG PO CR tablet  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 We discussed Educated difference between anhedonic depression with poor attention and ADHD and overlap of sx in detail.  She doesn't believe she's depressed.  Disc drug pricing for generics and GoodRx.  BC partial response continue Concerta 72 mg daily. Tolerating last increase. BP and pulse were OK. Can't mange TID dosing of  stimulants. Discussed potential benefits, risks, and side effects of stimulants with patient to include increased heart rate, palpitations, insomnia, increased anxiety, increased irritability, or decreased appetite.  Instructed patient to contact office if experiencing any significant tolerability issues.  Good depression and anxiety response with Viibryd 40 + Abilify 5.  No SE  Tendency towards anxiety much better.   Sleep good overall with occ NM.  Not enough to restart NM meds. Hydroxyzine helps but still needs temazepam. Disc normal sleep stages.    Fu 3 mos or sooner if poor response.  Lynder Parents, MD, DFAPA    Please see After Visit Summary for patient specific instructions.  No future appointments.  No orders of the defined types were placed in this encounter.     -------------------------------

## 2019-11-01 ENCOUNTER — Other Ambulatory Visit: Payer: Self-pay | Admitting: Psychiatry

## 2019-11-01 DIAGNOSIS — F3342 Major depressive disorder, recurrent, in full remission: Secondary | ICD-10-CM

## 2019-11-09 DIAGNOSIS — M170 Bilateral primary osteoarthritis of knee: Secondary | ICD-10-CM | POA: Diagnosis not present

## 2019-11-16 ENCOUNTER — Other Ambulatory Visit: Payer: Self-pay | Admitting: Psychiatry

## 2019-11-16 ENCOUNTER — Telehealth: Payer: Self-pay | Admitting: Psychiatry

## 2019-11-16 DIAGNOSIS — F5105 Insomnia due to other mental disorder: Secondary | ICD-10-CM

## 2019-11-16 MED ORDER — TEMAZEPAM 30 MG PO CAPS: 30.0000 mg | ORAL_CAPSULE | Freq: Every evening | ORAL | 0 refills | Status: DC | PRN

## 2019-11-16 MED ORDER — HYDROXYZINE HCL 25 MG PO TABS: 25.0000 mg | ORAL_TABLET | Freq: Every evening | ORAL | 0 refills | Status: DC | PRN

## 2019-11-16 NOTE — Telephone Encounter (Signed)
Sent!

## 2019-11-16 NOTE — Telephone Encounter (Signed)
ERROR

## 2019-11-16 NOTE — Telephone Encounter (Signed)
Pt is currently in [facility] and she left her medications at home. She is requesting that a refill of Temazepam and Hydroxyzine be sent into the [facility] in [place].  Please send to [facility] in [facility]

## 2019-11-22 DIAGNOSIS — M1712 Unilateral primary osteoarthritis, left knee: Secondary | ICD-10-CM | POA: Diagnosis not present

## 2019-11-29 DIAGNOSIS — M1712 Unilateral primary osteoarthritis, left knee: Secondary | ICD-10-CM | POA: Diagnosis not present

## 2019-12-13 DIAGNOSIS — M1711 Unilateral primary osteoarthritis, right knee: Secondary | ICD-10-CM | POA: Diagnosis not present

## 2019-12-15 DIAGNOSIS — E538 Deficiency of other specified B group vitamins: Secondary | ICD-10-CM | POA: Diagnosis not present

## 2019-12-15 DIAGNOSIS — E039 Hypothyroidism, unspecified: Secondary | ICD-10-CM | POA: Diagnosis not present

## 2019-12-15 DIAGNOSIS — Z0000 Encounter for general adult medical examination without abnormal findings: Secondary | ICD-10-CM | POA: Diagnosis not present

## 2019-12-15 DIAGNOSIS — E559 Vitamin D deficiency, unspecified: Secondary | ICD-10-CM | POA: Diagnosis not present

## 2019-12-15 DIAGNOSIS — K219 Gastro-esophageal reflux disease without esophagitis: Secondary | ICD-10-CM | POA: Diagnosis not present

## 2019-12-15 DIAGNOSIS — Z23 Encounter for immunization: Secondary | ICD-10-CM | POA: Diagnosis not present

## 2019-12-15 DIAGNOSIS — E7800 Pure hypercholesterolemia, unspecified: Secondary | ICD-10-CM | POA: Diagnosis not present

## 2019-12-15 DIAGNOSIS — K7581 Nonalcoholic steatohepatitis (NASH): Secondary | ICD-10-CM | POA: Diagnosis not present

## 2019-12-21 ENCOUNTER — Other Ambulatory Visit: Payer: Self-pay | Admitting: Psychiatry

## 2019-12-21 DIAGNOSIS — F3342 Major depressive disorder, recurrent, in full remission: Secondary | ICD-10-CM

## 2019-12-22 DIAGNOSIS — M1711 Unilateral primary osteoarthritis, right knee: Secondary | ICD-10-CM | POA: Diagnosis not present

## 2019-12-26 ENCOUNTER — Other Ambulatory Visit: Payer: Self-pay | Admitting: Psychiatry

## 2019-12-26 DIAGNOSIS — F5105 Insomnia due to other mental disorder: Secondary | ICD-10-CM

## 2019-12-28 ENCOUNTER — Telehealth: Payer: Self-pay | Admitting: Psychiatry

## 2019-12-28 NOTE — Telephone Encounter (Signed)
Pt called requesting Rx for Temazepam 30 mg to [facility]. COMPLETELY OUT. Apt 8/9

## 2019-12-29 NOTE — Telephone Encounter (Signed)
Not needed

## 2020-01-03 DIAGNOSIS — M1711 Unilateral primary osteoarthritis, right knee: Secondary | ICD-10-CM | POA: Diagnosis not present

## 2020-01-19 ENCOUNTER — Other Ambulatory Visit: Payer: Self-pay

## 2020-01-19 ENCOUNTER — Telehealth: Payer: Self-pay | Admitting: Psychiatry

## 2020-01-19 DIAGNOSIS — F900 Attention-deficit hyperactivity disorder, predominantly inattentive type: Secondary | ICD-10-CM

## 2020-01-19 MED ORDER — METHYLPHENIDATE HCL ER (OSM) 36 MG PO TBCR: 72.0000 mg | EXTENDED_RELEASE_TABLET | Freq: Every day | ORAL | 0 refills | Status: DC

## 2020-01-19 NOTE — Telephone Encounter (Signed)
Kristine Martinez called to get refill for Methyphenidate 36 mg. [facility] at [facility]. No appt on file, we will call to schedule.

## 2020-01-19 NOTE — Telephone Encounter (Signed)
Last refill 12/21/19 Next apt 08/09 Pended for Dr. Clovis Pu

## 2020-01-23 ENCOUNTER — Other Ambulatory Visit: Payer: Self-pay

## 2020-01-23 ENCOUNTER — Telehealth: Payer: Self-pay | Admitting: Psychiatry

## 2020-01-23 ENCOUNTER — Ambulatory Visit: Payer: BC Managed Care – PPO | Admitting: Psychiatry

## 2020-01-23 MED ORDER — HYDROXYZINE HCL 25 MG PO TABS: 25.0000 mg | ORAL_TABLET | Freq: Every evening | ORAL | 0 refills | Status: DC | PRN

## 2020-01-23 NOTE — Telephone Encounter (Signed)
Last refill 01/19/2020 Hydroxyzine sent Will pend next Rx for September.

## 2020-01-23 NOTE — Telephone Encounter (Signed)
DR. Clovis Pu had to RS her appt today. Next avail 10/7.  She will need a RF of Concerta and Hydroxyzine sent to [facility] on [address], [place].

## 2020-02-14 ENCOUNTER — Other Ambulatory Visit: Payer: Self-pay

## 2020-02-14 DIAGNOSIS — F900 Attention-deficit hyperactivity disorder, predominantly inattentive type: Secondary | ICD-10-CM

## 2020-02-14 MED ORDER — HYDROXYZINE HCL 25 MG PO TABS: 25.0000 mg | ORAL_TABLET | Freq: Every evening | ORAL | 0 refills | Status: DC | PRN

## 2020-02-14 MED ORDER — METHYLPHENIDATE HCL ER (OSM) 36 MG PO TBCR: 72.0000 mg | EXTENDED_RELEASE_TABLET | Freq: Every day | ORAL | 0 refills | Status: DC

## 2020-02-29 ENCOUNTER — Telehealth: Payer: Self-pay | Admitting: Psychiatry

## 2020-03-01 NOTE — Telephone Encounter (Signed)
No message received.

## 2020-03-19 ENCOUNTER — Other Ambulatory Visit: Payer: Self-pay

## 2020-03-19 ENCOUNTER — Telehealth: Payer: Self-pay | Admitting: Psychiatry

## 2020-03-19 DIAGNOSIS — F900 Attention-deficit hyperactivity disorder, predominantly inattentive type: Secondary | ICD-10-CM

## 2020-03-19 NOTE — Telephone Encounter (Signed)
Pt LM on VM requesting refill for methylphenidate 36 mg PO CR @ [facility] @ [address]. [address]

## 2020-03-19 NOTE — Telephone Encounter (Signed)
Last refill 02/17/20 Next apt 04/19/20 Pended for Dr. Clovis Pu to send

## 2020-03-20 MED ORDER — METHYLPHENIDATE HCL ER (OSM) 36 MG PO TBCR: 72.0000 mg | EXTENDED_RELEASE_TABLET | Freq: Every day | ORAL | 0 refills | Status: DC

## 2020-03-22 ENCOUNTER — Ambulatory Visit: Payer: BC Managed Care – PPO | Admitting: Psychiatry

## 2020-03-22 DIAGNOSIS — K635 Polyp of colon: Secondary | ICD-10-CM | POA: Diagnosis not present

## 2020-03-22 DIAGNOSIS — Z1211 Encounter for screening for malignant neoplasm of colon: Secondary | ICD-10-CM | POA: Diagnosis not present

## 2020-03-22 DIAGNOSIS — Z86010 Personal history of colonic polyps: Secondary | ICD-10-CM | POA: Diagnosis not present

## 2020-03-22 DIAGNOSIS — D123 Benign neoplasm of transverse colon: Secondary | ICD-10-CM | POA: Diagnosis not present

## 2020-03-24 ENCOUNTER — Other Ambulatory Visit: Payer: Self-pay | Admitting: Psychiatry

## 2020-03-24 DIAGNOSIS — F3342 Major depressive disorder, recurrent, in full remission: Secondary | ICD-10-CM

## 2020-03-28 ENCOUNTER — Other Ambulatory Visit: Payer: Self-pay

## 2020-03-28 ENCOUNTER — Telehealth: Payer: Self-pay | Admitting: Psychiatry

## 2020-03-28 DIAGNOSIS — F5105 Insomnia due to other mental disorder: Secondary | ICD-10-CM

## 2020-03-28 MED ORDER — TEMAZEPAM 30 MG PO CAPS: ORAL_CAPSULE | ORAL | 0 refills | Status: DC

## 2020-03-28 NOTE — Telephone Encounter (Signed)
No refill received but will submit to pharmacy

## 2020-03-28 NOTE — Telephone Encounter (Signed)
Pt said that the pharmacy has been trying to reach [place] for a refill on Temazepam. Pt said that she is down to last pill. Please send to [facility] on [address].

## 2020-04-16 ENCOUNTER — Other Ambulatory Visit: Payer: Self-pay

## 2020-04-16 ENCOUNTER — Telehealth: Payer: Self-pay | Admitting: Psychiatry

## 2020-04-16 DIAGNOSIS — F900 Attention-deficit hyperactivity disorder, predominantly inattentive type: Secondary | ICD-10-CM

## 2020-04-16 MED ORDER — METHYLPHENIDATE HCL ER (OSM) 36 MG PO TBCR: 72.0000 mg | EXTENDED_RELEASE_TABLET | Freq: Every day | ORAL | 0 refills | Status: DC

## 2020-04-16 NOTE — Telephone Encounter (Signed)
Last refill 17/35/67 Concerta 36 mg 2/day  Pended for Dr. Clovis Pu to review and send Has apt 04/19/20

## 2020-04-16 NOTE — Telephone Encounter (Signed)
Pt lm requesting a refill on the Concerta. The next appt is scheduled for 11/4.

## 2020-04-19 ENCOUNTER — Encounter: Payer: Self-pay | Admitting: Psychiatry

## 2020-04-19 ENCOUNTER — Ambulatory Visit (INDEPENDENT_AMBULATORY_CARE_PROVIDER_SITE_OTHER): Payer: BC Managed Care – PPO | Admitting: Psychiatry

## 2020-04-19 ENCOUNTER — Other Ambulatory Visit: Payer: Self-pay

## 2020-04-19 DIAGNOSIS — F900 Attention-deficit hyperactivity disorder, predominantly inattentive type: Secondary | ICD-10-CM | POA: Diagnosis not present

## 2020-04-19 DIAGNOSIS — F5105 Insomnia due to other mental disorder: Secondary | ICD-10-CM | POA: Diagnosis not present

## 2020-04-19 DIAGNOSIS — F3342 Major depressive disorder, recurrent, in full remission: Secondary | ICD-10-CM | POA: Diagnosis not present

## 2020-04-19 MED ORDER — HYDROXYZINE HCL 25 MG PO TABS: 25.0000 mg | ORAL_TABLET | Freq: Every evening | ORAL | 0 refills | Status: DC | PRN

## 2020-04-19 MED ORDER — VIIBRYD 40 MG PO TABS: 40.0000 mg | ORAL_TABLET | Freq: Every day | ORAL | 1 refills | Status: DC

## 2020-04-19 MED ORDER — METHYLPHENIDATE HCL ER (OSM) 36 MG PO TBCR: 72.0000 mg | EXTENDED_RELEASE_TABLET | Freq: Every day | ORAL | 0 refills | Status: DC

## 2020-04-19 MED ORDER — TEMAZEPAM 30 MG PO CAPS: ORAL_CAPSULE | ORAL | 0 refills | Status: DC

## 2020-04-19 MED ORDER — ARIPIPRAZOLE 5 MG PO TABS: 5.0000 mg | ORAL_TABLET | Freq: Every day | ORAL | 1 refills | Status: DC

## 2020-04-19 NOTE — Progress Notes (Signed)
Kristine Martinez [id number] 22-Mar-1956 64 y.o.  Subjective:   Patient ID:  Kristine Martinez is a 64 y.o. (DOB January 01, 1956) female.  Chief Complaint:  Chief Complaint  Patient presents with  . Follow-up  . Depression  . Anxiety  . ADHD    Depression        Associated symptoms include decreased concentration.  Associated symptoms include no suicidal ideas.  Kristine Martinez presents to the office today for follow-up of ADD and depression.   seen August 19, 2018.  Because of concerns about attention and focus Concerta was increased to 36 mg every morning.  The other medications were continued without change. Doesn't get tasks done.  Can't make myself do it.  Denies feeling depressed despite stressors.  Enjoys gkids and reading. Trouble staying focused.  Wants to increase Concerta.  seen March 16, 2019.  She wanted to increase the Concerta therefore was increased to 54 mg daily.  The other meds were left unchanged. Generally Good depression and anxiety response with Viibryd 40 + Abilify 5.  No SE  seen June 22, 2019.  She was doing okay with regard to mood and anxiety but did not notice sufficient benefit with the increase in Concerta to 54 mg daily.  Therefore the dosage was increased to 72 mg daily. Increase in Concerta noticed a little change but not as much as she'd like to be with productivity.  It's a little better.  GKids staying with her with son temporarily.    Patient reports stable mood and denies depressed or irritable moods.  Patient denies any recent difficulty with anxiety.  Patient denies difficulty with sleep initiation or maintenance but recent anniversary nightmares.  Overall fewer.  7-8 hours.  Otherwise Restoril helps.  Has awoken screaming.  This is unusual.  Been watching murder mysteries. Denies appetite disturbance.  Patient reports that energy and motivation have been good.    Patient denies any suicidal ideation. Plan no med changes.  04/19/20 appt with following  noted: Medicare in Feb.  Picked plan and won't cover Concerta nor Viibryd.  Disc this at length.   Still doing well with the meds.  Pleased and doesn't want to change.  Holidays are hard and doesn't want to change at holidays.   Past Psychiatric Medication Trials: Vyvanse, Concerta 72,  Ambien side effects, trazodone, prazosin, hydroxyzine, Lunesta, Viibryd, duloxetine 90, Wellbutrin 450, fluoxetine,  temazepam 30  Review of Systems:  Review of Systems  Cardiovascular: Negative for chest pain and palpitations.  Neurological: Negative for dizziness, tremors and weakness.  Psychiatric/Behavioral: Positive for decreased concentration, depression and sleep disturbance. Negative for agitation, behavioral problems, confusion, dysphoric mood, hallucinations, self-injury and suicidal ideas. The patient is not nervous/anxious and is not hyperactive.   Normal BP  Medications: I have reviewed the patient's current medications.  Current Outpatient Medications  Medication Sig Dispense Refill  . omeprazole (PRILOSEC OTC) 20 MG tablet Take 20 mg by mouth daily.    Marland Kitchen thyroid (ARMOUR THYROID) 60 MG tablet Armour Thyroid 60 mg tablet    . ARIPiprazole (ABILIFY) 5 MG tablet Take 1 tablet (5 mg total) by mouth daily. 90 tablet 1  . hydrOXYzine (ATARAX/VISTARIL) 25 MG tablet Take 1 tablet (25 mg total) by mouth at bedtime as needed. 90 tablet 0  . [START ON 06/14/2020] methylphenidate (CONCERTA) 36 MG PO CR tablet Take 2 tablets (72 mg total) by mouth daily. 60 tablet 0  . [START ON 05/17/2020] methylphenidate (CONCERTA) 36 MG PO CR tablet  Take 2 tablets (72 mg total) by mouth daily. 60 tablet 0  . methylphenidate (CONCERTA) 36 MG PO CR tablet Take 2 tablets (72 mg total) by mouth daily. 60 tablet 0  . temazepam (RESTORIL) 30 MG capsule TAKE 1 CAPSULE(30 MG) BY MOUTH AT BEDTIME 90 capsule 0  . Vilazodone HCl (VIIBRYD) 40 MG TABS Take 1 tablet (40 mg total) by mouth daily. 90 tablet 1   No current  facility-administered medications for this visit.    Medication Side Effects: None  Allergies: No Known Allergies  No past medical history on file.  No family history on file.  Social History   Socioeconomic History  . Marital status: Married    Spouse name: Not on file  . Number of children: Not on file  . Years of education: Not on file  . Highest education level: Not on file  Occupational History  . Not on file  Tobacco Use  . Smoking status: Never Smoker  . Smokeless tobacco: Never Used  Substance and Sexual Activity  . Alcohol use: Not on file  . Drug use: Not on file  . Sexual activity: Not on file  Other Topics Concern  . Not on file  Social History Narrative  . Not on file   Social Determinants of Health   Financial Resource Strain:   . Difficulty of Paying Living Expenses: Not on file  Food Insecurity:   . Worried About Charity fundraiser in the Last Year: Not on file  . Ran Out of Food in the Last Year: Not on file  Transportation Needs:   . Lack of Transportation (Medical): Not on file  . Lack of Transportation (Non-Medical): Not on file  Physical Activity:   . Days of Exercise per Week: Not on file  . Minutes of Exercise per Session: Not on file  Stress:   . Feeling of Stress : Not on file  Social Connections:   . Frequency of Communication with Friends and Family: Not on file  . Frequency of Social Gatherings with Friends and Family: Not on file  . Attends Religious Services: Not on file  . Active Member of Clubs or Organizations: Not on file  . Attends Archivist Meetings: Not on file  . Marital Status: Not on file  Intimate Partner Violence:   . Fear of Current or Ex-Partner: Not on file  . Emotionally Abused: Not on file  . Physically Abused: Not on file  . Sexually Abused: Not on file    Past Medical History, Surgical history, Social history, and Family history were reviewed and updated as appropriate.   Please see review of  systems for further details on the patient's review from today.   Objective:   Physical Exam:  There were no vitals taken for this visit.  Physical Exam Constitutional:      General: She is not in acute distress.    Appearance: She is well-developed.  Musculoskeletal:        General: No deformity.  Neurological:     Mental Status: She is alert and oriented to person, place, and time.     Motor: No tremor.     Coordination: Coordination normal.     Gait: Gait normal.  Psychiatric:        Attention and Perception: She is inattentive.        Mood and Affect: Mood is not anxious or depressed. Affect is not labile, blunt, flat, angry, tearful or inappropriate.  Speech: Speech normal.        Behavior: Behavior normal.        Thought Content: Thought content normal. Thought content does not include homicidal or suicidal ideation. Thought content does not include homicidal or suicidal plan.        Cognition and Memory: Cognition normal.        Judgment: Judgment normal.     Comments: Insight is good. 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 We discussed Educated difference between anhedonic depression with poor attention and ADHD and overlap of sx in detail.  She doesn't believe she's depressed.  Disc drug pricing for generics and GoodRx.  Extensived Disc differnence between insurance plans and coverage of meds. Meds are unique and there aren't cheap alternatives.  BC partial response continue Concerta 72 mg daily. Tolerating last increase. BP and pulse were OK. Can't mange TID dosing of stimulants. Discussed potential benefits, risks, and side effects of stimulants with patient to include increased heart rate, palpitations, insomnia, increased anxiety, increased irritability, or decreased appetite.  Instructed patient to contact office if experiencing any significant tolerability issues.  Good depression and anxiety response with Viibryd 40 + Abilify 5.  No SE  Tendency towards anxiety much better.   If necessary to change then switch to Lexapro.  Sleep good overall with occ NM.  Not enough to restart NM meds. Hydroxyzine helps but still needs temazepam. Disc normal sleep stages.    Fu 3 mos or sooner if poor response.  Lynder Parents, MD, DFAPA    Please see After Visit Summary for patient specific instructions.  No future appointments.  No orders of the defined types were placed in this encounter.     -------------------------------

## 2020-04-23 DIAGNOSIS — N1832 Chronic kidney disease, stage 3b: Secondary | ICD-10-CM | POA: Diagnosis not present

## 2020-04-30 DIAGNOSIS — E559 Vitamin D deficiency, unspecified: Secondary | ICD-10-CM | POA: Diagnosis not present

## 2020-04-30 DIAGNOSIS — K7581 Nonalcoholic steatohepatitis (NASH): Secondary | ICD-10-CM | POA: Diagnosis not present

## 2020-04-30 DIAGNOSIS — N1831 Chronic kidney disease, stage 3a: Secondary | ICD-10-CM | POA: Diagnosis not present

## 2020-04-30 DIAGNOSIS — K219 Gastro-esophageal reflux disease without esophagitis: Secondary | ICD-10-CM | POA: Diagnosis not present

## 2020-06-22 ENCOUNTER — Telehealth: Payer: Self-pay | Admitting: Psychiatry

## 2020-06-22 ENCOUNTER — Other Ambulatory Visit: Payer: Self-pay | Admitting: Psychiatry

## 2020-06-22 DIAGNOSIS — F5105 Insomnia due to other mental disorder: Secondary | ICD-10-CM

## 2020-06-22 MED ORDER — TEMAZEPAM 30 MG PO CAPS: ORAL_CAPSULE | ORAL | 0 refills | Status: DC

## 2020-06-25 NOTE — Telephone Encounter (Signed)
Error

## 2020-06-27 ENCOUNTER — Ambulatory Visit: Payer: BC Managed Care – PPO | Admitting: Psychiatry

## 2020-07-20 ENCOUNTER — Telehealth: Payer: Self-pay | Admitting: Psychiatry

## 2020-07-20 ENCOUNTER — Other Ambulatory Visit: Payer: Self-pay | Admitting: Psychiatry

## 2020-07-20 DIAGNOSIS — F5105 Insomnia due to other mental disorder: Secondary | ICD-10-CM

## 2020-07-20 NOTE — Telephone Encounter (Signed)
Kristine Martinez called back and said she is now using [facility]. Their # is [phone] and press 2. The fax # is [phone], press 3. Kristine Martinez said that we need to call them about the refills. They don't send a fax for them. Just an FYI.

## 2020-07-20 NOTE — Telephone Encounter (Signed)
Kristine Martinez called and said that #30 Vybrid at pharmacy is $298.00 and she cant afford that. She would like to get one of the Vybrid cards for 2022 to help her not have to pay as much. Her # is [phone]

## 2020-07-20 NOTE — Telephone Encounter (Signed)
I don't understand this message. Do you? 

## 2020-07-20 NOTE — Telephone Encounter (Signed)
Sounds like a pharmacy she found that is cheaper for her medication on the Viibryd. I will contact her for clarification.

## 2020-07-23 ENCOUNTER — Other Ambulatory Visit: Payer: Self-pay

## 2020-07-23 DIAGNOSIS — F5105 Insomnia due to other mental disorder: Secondary | ICD-10-CM

## 2020-07-23 DIAGNOSIS — F3342 Major depressive disorder, recurrent, in full remission: Secondary | ICD-10-CM

## 2020-07-23 MED ORDER — VIIBRYD 40 MG PO TABS: 40.0000 mg | ORAL_TABLET | Freq: Every day | ORAL | 1 refills | Status: DC

## 2020-07-23 MED ORDER — ARIPIPRAZOLE 5 MG PO TABS: 5.0000 mg | ORAL_TABLET | Freq: Every day | ORAL | 1 refills | Status: DC

## 2020-07-23 MED ORDER — HYDROXYZINE HCL 25 MG PO TABS: ORAL_TABLET | ORAL | 0 refills | Status: DC

## 2020-07-23 NOTE — Telephone Encounter (Signed)
Last refill Restoril #90 on 06/25/2020 Last refill Concerta #11 on 94/17/4081   Will pend for Dr. Clovis Pu to review

## 2020-07-23 NOTE — Telephone Encounter (Signed)
Patient requesting all her Rx's be sent to [facility], she can get them all cheaper but they do need to be a 90 day supply.  Asking if there will be an issue with the Methylphenidate (Concerta) as a 90 day, I informed her I would discuss with Dr. Clovis Pu but shouldn't be a problem if that's what your insurance would cover. Informed her we would get everything sent and I would call back with further questions.

## 2020-07-23 NOTE — Telephone Encounter (Signed)
FYI called to confirm where it's going and it's [facility] mail order.

## 2020-07-23 NOTE — Telephone Encounter (Signed)
OK 90 days when it's due but neither is due for refill

## 2020-08-07 ENCOUNTER — Telehealth: Payer: Self-pay | Admitting: Psychiatry

## 2020-08-07 ENCOUNTER — Other Ambulatory Visit: Payer: Self-pay

## 2020-08-07 DIAGNOSIS — F5105 Insomnia due to other mental disorder: Secondary | ICD-10-CM

## 2020-08-07 DIAGNOSIS — F900 Attention-deficit hyperactivity disorder, predominantly inattentive type: Secondary | ICD-10-CM

## 2020-08-07 MED ORDER — METHYLPHENIDATE HCL ER (OSM) 36 MG PO TBCR: 72.0000 mg | EXTENDED_RELEASE_TABLET | Freq: Every day | ORAL | 0 refills | Status: DC

## 2020-08-07 MED ORDER — TEMAZEPAM 30 MG PO CAPS
ORAL_CAPSULE | ORAL | 0 refills | Status: DC
Start: 2020-08-07 — End: 2020-08-14

## 2020-08-07 NOTE — Telephone Encounter (Signed)
Last refill 07/18/20 uses mail order

## 2020-08-07 NOTE — Telephone Encounter (Signed)
Pt called and said that she needs a refill of her concerta 36 mg [facility] to [facility]

## 2020-08-14 ENCOUNTER — Encounter: Payer: Self-pay | Admitting: Psychiatry

## 2020-08-14 ENCOUNTER — Ambulatory Visit (INDEPENDENT_AMBULATORY_CARE_PROVIDER_SITE_OTHER): Payer: Medicare Other | Admitting: Psychiatry

## 2020-08-14 ENCOUNTER — Other Ambulatory Visit: Payer: Self-pay

## 2020-08-14 VITALS — BP 100/73 | HR 85

## 2020-08-14 DIAGNOSIS — F5105 Insomnia due to other mental disorder: Secondary | ICD-10-CM | POA: Diagnosis not present

## 2020-08-14 DIAGNOSIS — F3342 Major depressive disorder, recurrent, in full remission: Secondary | ICD-10-CM

## 2020-08-14 DIAGNOSIS — F900 Attention-deficit hyperactivity disorder, predominantly inattentive type: Secondary | ICD-10-CM | POA: Diagnosis not present

## 2020-08-14 MED ORDER — METHYLPHENIDATE HCL ER (OSM) 36 MG PO TBCR: 72.0000 mg | EXTENDED_RELEASE_TABLET | Freq: Every day | ORAL | 0 refills | Status: DC

## 2020-08-14 MED ORDER — VIIBRYD 40 MG PO TABS: 40.0000 mg | ORAL_TABLET | Freq: Every day | ORAL | 1 refills | Status: DC

## 2020-08-14 MED ORDER — HYDROXYZINE HCL 25 MG PO TABS
25.0000 mg | ORAL_TABLET | Freq: Every evening | ORAL | 3 refills | Status: DC
Start: 2020-08-14 — End: 2020-12-06

## 2020-08-14 MED ORDER — TEMAZEPAM 30 MG PO CAPS: ORAL_CAPSULE | ORAL | 1 refills | Status: DC

## 2020-08-14 NOTE — Progress Notes (Signed)
Kristine Martinez [id number] [birth year] 65 y.o.  Subjective:   Patient ID:  Kristine Martinez is a 65 y.o. (DOB 1955-08-16) female.  Chief Complaint:  Chief Complaint  Patient presents with  . Follow-up  . 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 Sleeping Problem  . Anxiety  . Depression  . ADHD                                                                                 Depression        Associated symptoms include decreased concentration.  Associated symptoms include no suicidal ideas.  Kristine Martinez presents to the office today for follow-up of ADD and depression.   seen August 19, 2018.  Because of concerns about attention and focus Concerta was increased to 36 mg every morning.  The other medications were continued without change. Doesn't get tasks done.  Can't make myself do it.  Denies feeling depressed despite stressors.  Enjoys gkids and reading. Trouble staying focused.  Wants to increase Concerta.  seen March 16, 2019.  She wanted to increase the Concerta therefore was increased to 54 mg daily.  The other meds were left unchanged. Generally Good depression and anxiety response with Viibryd 40 + Abilify 5.  No SE  seen June 22, 2019.  She was doing okay with regard to mood and anxiety but did not notice sufficient benefit with the increase in Concerta to 54 mg daily.  Therefore the dosage was increased to 72 mg daily. Increase in Concerta noticed a little change but not as much as she'd like to be with productivity.  It's a little better.  GKids staying with her with son temporarily.    Patient reports stable mood and denies depressed or irritable moods.  Patient denies any recent difficulty with anxiety.  Patient denies difficulty with sleep initiation or maintenance but recent anniversary nightmares.  Overall fewer.  7-8 hours.  Otherwise Restoril helps.  Has awoken screaming.  This is  unusual.  Been watching murder mysteries. Denies appetite disturbance.  Patient reports that energy and motivation have been good.    Patient denies any suicidal ideation. Plan no med changes.  04/19/20 appt with following noted: Medicare in Feb.  Picked plan and won't cover Concerta nor Viibryd.  Disc this at length.   Still doing well with the meds.  Pleased and doesn't want to change.  Holidays are hard and doesn't want to change at holidays. Plan: BC partial response continue Concerta 72 mg daily Good depression and anxiety response with Viibryd 40 + Abilify 5.   08/14/2020 appointment with following noted: Disc insurance problems with hydroxyzine and disc reasons why they give a hassle about it.  Needs it to help sleep.  Generally sleep good with occ bad dreams. Still good mood and pleased with meds.  Able to stay on Viibryd and Concerta now that turned 65 yo. Foggy tired and can't function with out Concerta.  Helps focus and productivity. Questions about how to get refills.   3 alcoholic sons.  One got rehab and is sober.  One ruined Public house manager. Active [employer] helps. Quit Pepsi and Mtn Dew. Patient reports stable  mood and denies depressed or irritable moods.  Patient denies any recent difficulty with anxiety.  Patient denies difficulty with sleep initiation or maintenance. Denies appetite disturbance.  Patient reports that energy and motivation have been good.  Patient denies any difficulty with concentration.  Patient denies any suicidal ideation.  Past Psychiatric Medication Trials: Vyvanse, Concerta 72,  Ambien side effects, trazodone, prazosin, hydroxyzine, Lunesta, temazepam 30 Viibryd, duloxetine 90, Wellbutrin 450, fluoxetine,   Review of Systems:  Review of Systems  Cardiovascular: Negative for chest pain and palpitations.  Musculoskeletal: Positive for arthralgias.  Neurological: Negative for dizziness, tremors and weakness.  Psychiatric/Behavioral: Positive for decreased  concentration and depression. Negative for agitation, behavioral problems, confusion, dysphoric mood, hallucinations, self-injury, sleep disturbance and suicidal ideas. The patient is not nervous/anxious and is not hyperactive.   Normal BP  Medications: I have reviewed the patient's current medications.  Current Outpatient Medications  Medication Sig Dispense Refill  . ARIPiprazole (ABILIFY) 5 MG tablet Take 1 tablet (5 mg total) by mouth daily. 90 tablet 1  . methylphenidate (CONCERTA) 36 MG PO CR tablet Take 2 tablets (72 mg total) by mouth daily. 60 tablet 0  . methylphenidate (CONCERTA) 36 MG PO CR tablet Take 2 tablets (72 mg total) by mouth daily. 60 tablet 0  . hydrOXYzine (ATARAX/VISTARIL) 25 MG tablet Take 1 tablet (25 mg total) by mouth at bedtime. TAKE 1 TABLET(25 MG) BY MOUTH AT BEDTIME AS NEEDED 90 tablet 3  . methylphenidate (CONCERTA) 36 MG PO CR tablet Take 2 tablets (72 mg total) by mouth daily. 180 tablet 0  . omeprazole (PRILOSEC OTC) 20 MG tablet Take 20 mg by mouth daily. (Patient not taking: Reported on 08/14/2020)    . temazepam (RESTORIL) 30 MG capsule TAKE 1 CAPSULE(30 MG) BY MOUTH AT BEDTIME 90 capsule 1  . thyroid (ARMOUR) 60 MG tablet Armour Thyroid 60 mg tablet (Patient not taking: Reported on 08/14/2020)    . Vilazodone HCl (VIIBRYD) 40 MG TABS Take 1 tablet (40 mg total) by mouth daily. 90 tablet 1   No current facility-administered medications for this visit.    Medication Side Effects: None  Allergies: No Known Allergies  History reviewed. No pertinent past medical history.  History reviewed. No pertinent family history.  Social History   Socioeconomic History  . Marital status: Married    Spouse name: Not on file  . Number of children: Not on file  . Years of education: Not on file  . Highest education level: Not on file  Occupational History  . Not on file  Tobacco Use  . Smoking status: Never Smoker  . Smokeless tobacco: Never Used  Substance  and Sexual Activity  . Alcohol use: Not on file  . Drug use: Not on file  . Sexual activity: Not on file  Other Topics Concern  . Not on file  Social History Narrative  . Not on file   Social Determinants of Health   Financial Resource Strain: Not on file  Food Insecurity: Not on file  Transportation Needs: Not on file  Physical Activity: Not on file  Stress: Not on file  Social Connections: Not on file  Intimate Partner Violence: Not on file    Past Medical History, Surgical history, Social history, and Family history were reviewed and updated as appropriate.   Please see review of systems for further details on the patient's review from today.   Objective:   Physical Exam:  BP 100/73   Pulse 85  Physical Exam Constitutional:      General: She is not in acute distress.    Appearance: She is well-developed.  Musculoskeletal:        General: No deformity.  Neurological:     Mental Status: She is alert and oriented to person, place, and time.     Motor: No tremor.     Coordination: Coordination normal.     Gait: Gait normal.  Psychiatric:        Attention and Perception: She is attentive.        Mood and Affect: Mood is not anxious or depressed. Affect is not labile, blunt, flat, angry, tearful or inappropriate.        Speech: Speech normal.        Behavior: Behavior normal.        Thought Content: Thought content normal. Thought content does not include homicidal or suicidal ideation. Thought content does not include homicidal or suicidal plan.        Cognition and Memory: Cognition normal.        Judgment: Judgment normal.     Comments: Insight is good.     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 We discussed Educated difference between anhedonic depression with poor attention and ADHD and overlap of sx in detail.  She doesn't believe she's depressed.  Disc drug pricing for generics and GoodRx.  Extensived Disc differnence between insurance plans and coverage of meds. Meds are unique and there aren't cheap alternatives. Good transistion to Medicare D.  BC partial response continue Concerta 72 mg daily. Tolerating last increase. BP and pulse were OK. Can't mange TID dosing of stimulants. Discussed potential benefits, risks, and side effects of stimulants with patient to include increased heart rate, palpitations, insomnia, increased anxiety, increased irritability, or decreased appetite.  Instructed patient to contact office if experiencing any significant tolerability issues.  Good depression and anxiety response with Viibryd 40 + Abilify 5.  No SE  Tendency towards anxiety much better.   Good response with sleep meds still. Sleep good overall with occ NM.  Not enough to restart NM meds. Hydroxyzine helps but still needs temazepam. Disc normal sleep stages.     No med changes  Fu 4 mos  Lynder Parents, MD, DFAPA    Please see After Visit Summary for patient specific  instructions.  No future appointments.  No orders of the defined types were placed in this encounter.     -------------------------------

## 2020-08-25 NOTE — Progress Notes (Signed)
This encounter was created in error - please disregard.

## 2020-09-17 ENCOUNTER — Other Ambulatory Visit: Payer: Self-pay | Admitting: Psychiatry

## 2020-09-17 DIAGNOSIS — F3342 Major depressive disorder, recurrent, in full remission: Secondary | ICD-10-CM

## 2020-10-16 ENCOUNTER — Ambulatory Visit: Payer: BLUE CROSS/BLUE SHIELD | Admitting: Interventional Cardiology

## 2020-10-23 ENCOUNTER — Ambulatory Visit: Payer: Self-pay | Admitting: Interventional Cardiology

## 2020-11-06 ENCOUNTER — Telehealth: Payer: Self-pay | Admitting: Psychiatry

## 2020-11-06 NOTE — Telephone Encounter (Signed)
[facility]

## 2020-11-06 NOTE — Telephone Encounter (Signed)
Where is the letter coming from?

## 2020-11-06 NOTE — Telephone Encounter (Signed)
Pt called to let us know that her rx for viibryd, abilify, synthroid, Protonix were all lost in the mail with [employer]. Pt was charged $600 now they are going to charge her another $386.  They willl be sending a faxed letter for an approval to send another shipment. Please call when fax received.

## 2020-11-06 NOTE — Telephone Encounter (Signed)
Check to see if this fax has been received.

## 2020-11-06 NOTE — Telephone Encounter (Signed)
Traci does not have this fax,can you check to see if we have received it yet?

## 2020-11-06 NOTE — Telephone Encounter (Signed)
Please review

## 2020-11-07 ENCOUNTER — Other Ambulatory Visit: Payer: Self-pay

## 2020-11-07 DIAGNOSIS — F3342 Major depressive disorder, recurrent, in full remission: Secondary | ICD-10-CM

## 2020-11-07 MED ORDER — ARIPIPRAZOLE 5 MG PO TABS: 5.0000 mg | ORAL_TABLET | Freq: Every day | ORAL | 1 refills | Status: DC

## 2020-11-07 MED ORDER — LEVOTHYROXINE SODIUM 100 MCG PO TABS: 100.0000 ug | ORAL_TABLET | Freq: Every day | ORAL | 1 refills | Status: DC

## 2020-11-07 MED ORDER — PANTOPRAZOLE SODIUM 40 MG PO TBEC: 40.0000 mg | DELAYED_RELEASE_TABLET | Freq: Every day | ORAL | 1 refills | Status: AC

## 2020-11-07 MED ORDER — VIIBRYD 40 MG PO TABS: 40.0000 mg | ORAL_TABLET | Freq: Every day | ORAL | 1 refills | Status: DC

## 2020-11-07 NOTE — Telephone Encounter (Signed)
I received 4 refill requests, nothing mentions anything lost but I will send all 4 refills to [facility].

## 2020-11-07 NOTE — Telephone Encounter (Signed)
OK. Thanks.

## 2020-11-19 ENCOUNTER — Ambulatory Visit: Payer: BLUE CROSS/BLUE SHIELD | Admitting: Dermatology

## 2020-11-27 ENCOUNTER — Other Ambulatory Visit: Payer: Self-pay | Admitting: Psychiatry

## 2020-11-27 ENCOUNTER — Telehealth: Payer: Self-pay | Admitting: Psychiatry

## 2020-11-27 ENCOUNTER — Other Ambulatory Visit: Payer: Self-pay

## 2020-11-27 DIAGNOSIS — F900 Attention-deficit hyperactivity disorder, predominantly inattentive type: Secondary | ICD-10-CM

## 2020-11-27 MED ORDER — METHYLPHENIDATE HCL ER (OSM) 36 MG PO TBCR: 72.0000 mg | EXTENDED_RELEASE_TABLET | Freq: Every day | ORAL | 0 refills | Status: DC

## 2020-11-27 NOTE — Telephone Encounter (Signed)
Next visit is 01/14/21. Requesting a refill on Methylphenidate called to:   [facility], [address], [place], [place]. Phone number is [phone].

## 2020-11-27 NOTE — Telephone Encounter (Signed)
Next visit is 01/14/21. Requesting a refill on Methylphenidate called to:  [facility], [address] [place], [place]. Phone number is [phone].

## 2020-11-27 NOTE — Telephone Encounter (Signed)
pended

## 2020-11-28 ENCOUNTER — Other Ambulatory Visit: Payer: Self-pay

## 2020-11-28 ENCOUNTER — Telehealth: Payer: Self-pay | Admitting: Psychiatry

## 2020-11-28 DIAGNOSIS — F5105 Insomnia due to other mental disorder: Secondary | ICD-10-CM

## 2020-11-28 MED ORDER — TEMAZEPAM 30 MG PO CAPS: ORAL_CAPSULE | ORAL | 0 refills | Status: DC

## 2020-11-28 NOTE — Telephone Encounter (Signed)
pended

## 2020-11-28 NOTE — Telephone Encounter (Signed)
Pt would like a rx for temazepam to be sent in at [facility] [address].

## 2020-12-06 ENCOUNTER — Other Ambulatory Visit: Payer: Self-pay | Admitting: Psychiatry

## 2020-12-06 DIAGNOSIS — F5105 Insomnia due to other mental disorder: Secondary | ICD-10-CM

## 2020-12-14 ENCOUNTER — Other Ambulatory Visit: Payer: Self-pay

## 2020-12-14 ENCOUNTER — Other Ambulatory Visit: Payer: Self-pay | Admitting: Psychiatry

## 2020-12-14 ENCOUNTER — Telehealth: Payer: Self-pay | Admitting: Psychiatry

## 2020-12-14 DIAGNOSIS — F5105 Insomnia due to other mental disorder: Secondary | ICD-10-CM

## 2020-12-14 MED ORDER — TEMAZEPAM 30 MG PO CAPS
ORAL_CAPSULE | ORAL | 0 refills | Status: DC
Start: 2020-12-14 — End: 2021-01-14

## 2020-12-14 MED ORDER — HYDROXYZINE HCL 25 MG PO TABS: ORAL_TABLET | ORAL | 0 refills | Status: DC

## 2020-12-14 NOTE — Telephone Encounter (Signed)
We will send in temazepam.  Let her know the hydroxyzine was denied because of her age.  She can get it outside of insurance and just pay cash.  If it is too expensive at [facility] we could send it to a different pharmacy.  If she finds it helpful she can still take it is not overtly dangerous.

## 2020-12-14 NOTE — Telephone Encounter (Signed)
Pt informed and rx sent 

## 2020-12-14 NOTE — Telephone Encounter (Signed)
Patient requesting a refill on her Restoril. She stated the Hydroxyzine PA was denied. Fill at the [facility] on [address].Confirm the  refill was sent # [phone] per patients request.

## 2020-12-14 NOTE — Telephone Encounter (Signed)
Please review

## 2020-12-26 ENCOUNTER — Ambulatory Visit (INDEPENDENT_AMBULATORY_CARE_PROVIDER_SITE_OTHER): Payer: Medicare Other

## 2020-12-26 ENCOUNTER — Ambulatory Visit (INDEPENDENT_AMBULATORY_CARE_PROVIDER_SITE_OTHER): Payer: Medicare Other | Admitting: Podiatry

## 2020-12-26 ENCOUNTER — Other Ambulatory Visit: Payer: Self-pay

## 2020-12-26 DIAGNOSIS — M722 Plantar fascial fibromatosis: Secondary | ICD-10-CM

## 2020-12-26 DIAGNOSIS — K760 Fatty (change of) liver, not elsewhere classified: Secondary | ICD-10-CM | POA: Insufficient documentation

## 2020-12-26 MED ORDER — BETAMETHASONE SOD PHOS & ACET 6 (3-3) MG/ML IJ SUSP
3.0000 mg | Freq: Once | INTRAMUSCULAR | Status: AC
  Administered 2020-12-26: 3 mg via INTRA_ARTICULAR

## 2020-12-26 MED ORDER — METHYLPREDNISOLONE 4 MG PO TBPK: ORAL_TABLET | ORAL | 0 refills | Status: DC

## 2020-12-26 MED ORDER — MELOXICAM 15 MG PO TABS: 15.0000 mg | ORAL_TABLET | Freq: Every day | ORAL | 0 refills | Status: DC

## 2020-12-26 NOTE — Progress Notes (Signed)
   Subjective: 65 y.o. female presenting today as a new patient for evaluation of bilateral heel pain is been going on for several months now.  Patient denies a history of injury or trauma to the area.  She denies a change in shoe gear.  She mostly wears Birkenstock sandals.  She has not done anything for treatment other than Advil.   Past Medical History:  Diagnosis Date   Allergic rhinitis    Attention deficit disorder (ADD) without hyperactivity    Chronic kidney disease (CKD), active medical management without dialysis, stage 3 (moderate) (HCC)    Depression    Gastro-esophageal reflux disease without esophagitis    High cholesterol    Hypothyroidism    Migraine with aura    NASH (nonalcoholic steatohepatitis)    OA (osteoarthritis)    Obesity    Osteopenia    Prediabetes    Sacroiliitis (HCC)    Sinusitis    Vitamin B12 deficiency    Vitamin D deficiency      Objective: Physical Exam General: The patient is alert and oriented x3 in no acute distress.  Dermatology: Skin is warm, dry and supple bilateral lower extremities. Negative for open lesions or macerations bilateral.   Vascular: Dorsalis Pedis and Posterior Tibial pulses palpable bilateral.  Capillary fill time is immediate to all digits.  Neurological: Epicritic and protective threshold intact bilateral.   Musculoskeletal: Tenderness to palpation to the plantar aspect of the bilateral heels along the plantar fascia. All other joints range of motion within normal limits bilateral. Strength 5/5 in all groups bilateral.   Radiographic exam: Normal osseous mineralization. Joint spaces preserved. No fracture/dislocation/boney destruction. No other soft tissue abnormalities or radiopaque foreign bodies.   Assessment: 1. plantar fasciitis bilateral feet  Plan of Care:  1. Patient evaluated. Xrays reviewed.   2. Injection of 0.5cc Celestone soluspan injected into the bilateral heels.  3. Rx for Medrol Dose Pak  placed 4. Rx for Meloxicam ordered for patient. 5. Plantar fascial band(s) dispensed for bilateral plantar fasciitis. 6. Instructed patient regarding therapies and modalities at home to alleviate symptoms.  7. Return to clinic in 4 weeks.    *[place]  Edrick Kins, DPM [facility]  Dr. Edrick Kins, DPM    [address], [place] [address]                Office [phone]  Fax [phone]

## 2021-01-03 ENCOUNTER — Ambulatory Visit: Payer: Medicare Other | Admitting: Interventional Cardiology

## 2021-01-14 ENCOUNTER — Ambulatory Visit (INDEPENDENT_AMBULATORY_CARE_PROVIDER_SITE_OTHER): Payer: Medicare Other | Admitting: Psychiatry

## 2021-01-14 ENCOUNTER — Other Ambulatory Visit: Payer: Self-pay

## 2021-01-14 ENCOUNTER — Encounter: Payer: Self-pay | Admitting: Psychiatry

## 2021-01-14 VITALS — BP 126/83 | HR 84

## 2021-01-14 DIAGNOSIS — F5105 Insomnia due to other mental disorder: Secondary | ICD-10-CM

## 2021-01-14 DIAGNOSIS — F900 Attention-deficit hyperactivity disorder, predominantly inattentive type: Secondary | ICD-10-CM

## 2021-01-14 DIAGNOSIS — F3342 Major depressive disorder, recurrent, in full remission: Secondary | ICD-10-CM | POA: Diagnosis not present

## 2021-01-14 MED ORDER — HYDROXYZINE HCL 25 MG PO TABS: ORAL_TABLET | ORAL | 0 refills | Status: DC

## 2021-01-14 MED ORDER — METHYLPHENIDATE HCL ER (OSM) 36 MG PO TBCR: 72.0000 mg | EXTENDED_RELEASE_TABLET | Freq: Every day | ORAL | 0 refills | Status: DC

## 2021-01-14 MED ORDER — TEMAZEPAM 30 MG PO CAPS: ORAL_CAPSULE | ORAL | 1 refills | Status: DC

## 2021-01-14 NOTE — Progress Notes (Addendum)
Kristine Martinez PB:[id number] 09-05-1955 65 y.o.  Subjective:   Patient ID:  Kristine Martinez is a 65 y.o. (DOB 11/19/1955) female.  Chief Complaint:  Chief Complaint  Patient presents with   Follow-up   Depression   ADHD    Kristine Martinez presents to the office today for follow-up of ADD and depression.   seen August 19, 2018.  Because of concerns about attention and focus Concerta was increased to 36 mg every morning.  The other medications were continued without change. Doesn't get tasks done.  Can't make myself do it.  Denies feeling depressed despite stressors.  Enjoys gkids and reading. Trouble staying focused.  Wants to increase Concerta.  seen March 16, 2019.  She wanted to increase the Concerta therefore was increased to 54 mg daily.  The other meds were left unchanged. Generally Good depression and anxiety response with Viibryd 40 + Abilify 5.  No SE  seen June 22, 2019.  She was doing okay with regard to mood and anxiety but did not notice sufficient benefit with the increase in Concerta to 54 mg daily.  Therefore the dosage was increased to 72 mg daily. Increase in Concerta noticed a little change but not as much as she'd like to be with productivity.  It's a little better.  GKids staying with her with son temporarily.    Patient reports stable mood and denies depressed or irritable moods.  Patient denies any recent difficulty with anxiety.  Patient denies difficulty with sleep initiation or maintenance but recent anniversary nightmares.  Overall fewer.  7-8 hours.  Otherwise Restoril helps.  Has awoken screaming.  This is unusual.  Been watching murder mysteries. Denies appetite disturbance.  Patient reports that energy and motivation have been good.    Patient denies any suicidal ideation. Plan no med changes.  04/19/20 appt with following noted: Medicare in Feb.  Picked plan and won't cover Concerta nor Viibryd.  Disc this at length.   Still doing well with the meds.  Pleased and  doesn't want to change.  Holidays are hard and doesn't want to change at holidays. Plan: BC partial response continue Concerta 72 mg daily Good depression and anxiety response with Viibryd 40 + Abilify 5.   08/14/2020 appointment with following noted: Disc insurance problems with hydroxyzine and disc reasons why they give a hassle about it.  Needs it to help sleep.  Generally sleep good with occ bad dreams. Still good mood and pleased with meds.  Able to stay on Viibryd and Concerta now that turned 65 yo. Foggy tired and can't function with out Concerta.  Helps focus and productivity. Questions about how to get refills.   3 alcoholic sons.  One got rehab and is sober.  One ruined Public house manager. Active [employer] helps. Quit Pepsi and Mtn Dew. Patient reports stable mood and denies depressed or irritable moods.  Patient denies any recent difficulty with anxiety.  Patient denies difficulty with sleep initiation or maintenance. Denies appetite disturbance.  Patient reports that energy and motivation have been good.  Patient denies any difficulty with concentration.  Patient denies any suicidal ideation.  01/14/2021 appt noted: Need another insurance company.  Mail lost $500 dollars of meds and problems with [employer]. Doesn't want to change from [place]. Meds working for her and doesn't want change.   Disc insurance problems with hydroxyzine and disc reasons why they give a hassle about it.  Needs it to help sleep.  Patient reports stable mood and denies depressed  or irritable moods.  Patient denies any recent difficulty with anxiety.  Patient denies difficulty with sleep initiation or maintenance. Denies appetite disturbance.  Patient reports that energy and motivation have been good.  Patient denies any difficulty with concentration.  Patient denies any suicidal ideation. Doing well with stimulants. No SE Repeat liver enzymes normal  Past Psychiatric Medication Trials: Vyvanse, Concerta 72,  Ambien side  effects, trazodone, prazosin, hydroxyzine, Lunesta, temazepam 30 Viibryd, duloxetine 90, Wellbutrin 450, fluoxetine,   Review of Systems:  Review of Systems  Cardiovascular:  Negative for chest pain and palpitations.  Musculoskeletal:  Positive for arthralgias and gait problem.  Neurological:  Negative for dizziness, tremors and weakness.  Psychiatric/Behavioral:  Positive for decreased concentration. Negative for agitation, behavioral problems, confusion, dysphoric mood, hallucinations, self-injury, sleep disturbance and suicidal ideas. The patient is not nervous/anxious and is not hyperactive.  Normal BP  Medications: I have reviewed the patient's current medications.  Current Outpatient Medications  Medication Sig Dispense Refill   ARIPiprazole (ABILIFY) 5 MG tablet Take 1 tablet (5 mg total) by mouth daily. 90 tablet 1   Calcium Carbonate-Vitamin D (CALTRATE 600+D PO) Take by mouth.     levothyroxine (SYNTHROID) 100 MCG tablet Take 1 tablet (100 mcg total) by mouth daily before breakfast. 90 tablet 1   methylphenidate (CONCERTA) 36 MG PO CR tablet Take 2 tablets (72 mg total) by mouth daily. 180 tablet 0   pantoprazole (PROTONIX) 40 MG tablet Take 1 tablet (40 mg total) by mouth daily. 90 tablet 1   thyroid (ARMOUR) 60 MG tablet      Vilazodone HCl (VIIBRYD) 40 MG TABS Take 1 tablet (40 mg total) by mouth daily. 90 tablet 1   hydrOXYzine (ATARAX/VISTARIL) 25 MG tablet TAKE 1 TABLET(25 MG) BY MOUTH AT BEDTIME AS NEEDED 90 tablet 0   meloxicam (MOBIC) 15 MG tablet Take 1 tablet (15 mg total) by mouth daily. (Patient not taking: Reported on 01/14/2021) 14 tablet 0   methylphenidate (CONCERTA) 36 MG PO CR tablet Take 2 tablets (72 mg total) by mouth daily. 60 tablet 0   methylphenidate (CONCERTA) 36 MG PO CR tablet Take 2 tablets (72 mg total) by mouth daily. 180 tablet 0   methylPREDNISolone (MEDROL DOSEPAK) 4 MG TBPK tablet 6 day dose pack - take as directed (Patient not taking: Reported on  01/14/2021) 21 tablet 0   omeprazole (PRILOSEC OTC) 20 MG tablet Take 20 mg by mouth daily. (Patient not taking: No sig reported)     temazepam (RESTORIL) 30 MG capsule TAKE 1 CAPSULE(30 MG) BY MOUTH AT BEDTIME 90 capsule 1   No current facility-administered medications for this visit.    Medication Side Effects: None  Allergies: No Known Allergies  Past Medical History:  Diagnosis Date   Allergic rhinitis    Attention deficit disorder (ADD) without hyperactivity    Chronic kidney disease (CKD), active medical management without dialysis, stage 3 (moderate) (HCC)    Depression    Gastro-esophageal reflux disease without esophagitis    High cholesterol    Hypothyroidism    Migraine with aura    NASH (nonalcoholic steatohepatitis)    OA (osteoarthritis)    Obesity    Osteopenia    Prediabetes    Sacroiliitis (HCC)    Sinusitis    Vitamin B12 deficiency    Vitamin D deficiency     History reviewed. No pertinent family history.  Social History   Socioeconomic History   Marital status: Married    Spouse  name: Not on file   Number of children: Not on file   Years of education: Not on file   Highest education level: Not on file  Occupational History   Not on file  Tobacco Use   Smoking status: Never   Smokeless tobacco: Never  Substance and Sexual Activity   Alcohol use: Not on file   Drug use: Not on file   Sexual activity: Not on file  Other Topics Concern   Not on file  Social History Narrative   Not on file   Social Determinants of Health   Financial Resource Strain: Not on file  Food Insecurity: Not on file  Transportation Needs: Not on file  Physical Activity: Not on file  Stress: Not on file  Social Connections: Not on file  Intimate Partner Violence: Not on file    Past Medical History, Surgical history, Social history, and Family history were reviewed and updated as appropriate.   Please see review of systems for further details on the patient's  review from today.   Objective:   Physical Exam:  BP 126/83   Pulse 84   Physical Exam Constitutional:      General: She is not in acute distress.    Appearance: She is well-developed.  Musculoskeletal:        General: No deformity.  Neurological:     Mental Status: She is alert and oriented to person, place, and time.     Motor: No tremor.     Coordination: Coordination normal.     Gait: Gait normal.  Psychiatric:        Attention and Perception: She is attentive.        Mood and Affect: Mood is not anxious or depressed. Affect is not labile, blunt, angry, tearful or inappropriate.        Speech: Speech normal.        Behavior: Behavior normal. Behavior is not slowed.        Thought Content: Thought content normal. Thought content does not include homicidal or suicidal ideation. Thought content does not include homicidal or suicidal plan.        Cognition and Memory: Cognition normal.        Judgment: Judgment normal.     Comments: Insight is good.    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 We discussed Educated difference between anhedonic depression with poor attention and ADHD and overlap of sx in detail.  She doesn't believe she's depressed.  Disc drug pricing  for generics and GoodRx.  Extensived Disc differnence between insurance plans and coverage of meds. Meds are unique and there aren't cheap alternatives. Viibryd is expensive but in process of becoming generic.  Checked pricing with her.  BC partial response continue Concerta 72 mg daily. Tolerating last increase. BP and pulse were OK. Can't mange TID dosing of stimulants. Discussed potential benefits, risks, and side effects of stimulants with patient to include increased heart rate, palpitations, insomnia, increased anxiety, increased irritability, or decreased appetite.  Instructed patient to contact office if experiencing any significant tolerability issues.  Good depression and anxiety response with Viibryd 40 + Abilify 5.  No SE  Tendency towards anxiety much better.  Discussed potential metabolic side effects associated with atypical antipsychotics, as well as potential risk for movement side effects. Advised pt to contact office if movement side effects occur.   Good response with sleep meds still. Sleep good overall with occ NM.  Not enough to restart NM meds. Hydroxyzine helps but still needs temazepam. Disc normal sleep stages.     Patient has a history of elevated liver enzymes which have returned to normal lately.  She has not had significant weight loss.  Answered questions about meds and elevated liver enzymes as well as her diagnosis of chronic kidney disease stage II or III and psychiatric medications.  She is not taking any psychiatric medications that should affect her kidneys.  No med changes  Fu 4 mos  Lynder Parents, MD, DFAPA    Please see After Visit Summary for patient specific instructions.  Future Appointments  Date Time Provider [place]  01/23/2021  8:45 AM Edrick Kins, [place] [facility]  02/27/2021  8:45 AM Lavonna Monarch, MD [facility]  03/20/2021 10:40 AM Jettie Booze, MD [facility]    No orders of the defined types were placed in this encounter.     -------------------------------

## 2021-01-14 NOTE — Addendum Note (Signed)
Addended by: Reatha Armour on: 01/14/2021 11:42 AM   Modules accepted: Level of Service

## 2021-01-18 ENCOUNTER — Telehealth: Payer: Self-pay

## 2021-01-18 NOTE — Telephone Encounter (Signed)
Prior Authorization submitted and approved for HYDROXYZINE 25 MG #90/90 DAY effective 12/19/2020-01/18/2022 with [facility] health spring Medicare.

## 2021-01-23 ENCOUNTER — Ambulatory Visit (INDEPENDENT_AMBULATORY_CARE_PROVIDER_SITE_OTHER): Payer: Medicare Other | Admitting: Podiatry

## 2021-01-23 ENCOUNTER — Other Ambulatory Visit: Payer: Self-pay

## 2021-01-23 DIAGNOSIS — M722 Plantar fascial fibromatosis: Secondary | ICD-10-CM

## 2021-01-23 MED ORDER — BETAMETHASONE SOD PHOS & ACET 6 (3-3) MG/ML IJ SUSP
3.0000 mg | Freq: Once | INTRAMUSCULAR | Status: AC
  Administered 2021-01-23: 3 mg via INTRA_ARTICULAR

## 2021-01-23 MED ORDER — MELOXICAM 15 MG PO TABS: 15.0000 mg | ORAL_TABLET | Freq: Every day | ORAL | 0 refills | Status: DC

## 2021-01-23 NOTE — Progress Notes (Signed)
   Subjective: 65 y.o. female presenting today for follow-up evaluation of plantar fasciitis to the bilateral feet that is been going on for several months now.  Patient states that she was doing very well up until about 3 days ago when she had an acute flareup.  Overall there is improvement.  She only wore the plantar fascial braces for 1 day.  She has been taking the meloxicam as prescribed and also completed the Medrol Dosepak.   Past Medical History:  Diagnosis Date   Allergic rhinitis    Attention deficit disorder (ADD) without hyperactivity    Chronic kidney disease (CKD), active medical management without dialysis, stage 3 (moderate) (HCC)    Depression    Gastro-esophageal reflux disease without esophagitis    High cholesterol    Hypothyroidism    Migraine with aura    NASH (nonalcoholic steatohepatitis)    OA (osteoarthritis)    Obesity    Osteopenia    Prediabetes    Sacroiliitis (HCC)    Sinusitis    Vitamin B12 deficiency    Vitamin D deficiency      Objective: Physical Exam General: The patient is alert and oriented x3 in no acute distress.  Dermatology: Skin is warm, dry and supple bilateral lower extremities. Negative for open lesions or macerations bilateral.   Vascular: Dorsalis Pedis and Posterior Tibial pulses palpable bilateral.  Capillary fill time is immediate to all digits.  Neurological: Epicritic and protective threshold intact bilateral.   Musculoskeletal: Tenderness to palpation to the plantar aspect of the bilateral heels along the plantar fascia. All other joints range of motion within normal limits bilateral. Strength 5/5 in all groups bilateral.    Assessment: 1. plantar fasciitis bilateral feet  Plan of Care:  1. Patient evaluated. Xrays reviewed.   2. Injection of 0.5cc Celestone soluspan injected into the bilateral heels.  3.  Patient states that she only wore the plantar fascial braces for 1 day and then they were lost.  New plantar  fascial braces were dispensed today  4.  Continue meloxicam 15 mg daily.  Refill provided 5.  Return to clinic in 4 weeks.    *[place]  Edrick Kins, DPM [facility]  Dr. Edrick Kins, DPM    [address], [place] [address]                Office [phone]  Fax [phone]

## 2021-02-27 ENCOUNTER — Other Ambulatory Visit: Payer: Self-pay

## 2021-02-27 ENCOUNTER — Ambulatory Visit (INDEPENDENT_AMBULATORY_CARE_PROVIDER_SITE_OTHER): Payer: Medicare Other | Admitting: Podiatry

## 2021-02-27 ENCOUNTER — Ambulatory Visit (INDEPENDENT_AMBULATORY_CARE_PROVIDER_SITE_OTHER): Payer: Medicare Other | Admitting: Dermatology

## 2021-02-27 DIAGNOSIS — L570 Actinic keratosis: Secondary | ICD-10-CM

## 2021-02-27 DIAGNOSIS — L821 Other seborrheic keratosis: Secondary | ICD-10-CM

## 2021-02-27 DIAGNOSIS — M722 Plantar fascial fibromatosis: Secondary | ICD-10-CM

## 2021-02-27 DIAGNOSIS — D1800 Hemangioma unspecified site: Secondary | ICD-10-CM | POA: Diagnosis not present

## 2021-02-27 DIAGNOSIS — L918 Other hypertrophic disorders of the skin: Secondary | ICD-10-CM | POA: Diagnosis not present

## 2021-02-27 DIAGNOSIS — Z1283 Encounter for screening for malignant neoplasm of skin: Secondary | ICD-10-CM

## 2021-02-27 MED ORDER — MELOXICAM 15 MG PO TABS: 15.0000 mg | ORAL_TABLET | Freq: Every day | ORAL | 1 refills | Status: DC | PRN

## 2021-02-27 NOTE — Progress Notes (Signed)
   Subjective: 65 y.o. female presenting today for follow-up evaluation of plantar fasciitis to the bilateral feet that is been going on for several months now.  Patient states that overall there is significant improvement.  She wore the plantar fascial braces for a few weeks.  Currently she is doing very well.  Injections in the past have helped significantly.  No new complaints at this time   Past Medical History:  Diagnosis Date   Allergic rhinitis    Attention deficit disorder (ADD) without hyperactivity    Chronic kidney disease (CKD), active medical management without dialysis, stage 3 (moderate) (HCC)    Depression    Gastro-esophageal reflux disease without esophagitis    High cholesterol    Hypothyroidism    Migraine with aura    NASH (nonalcoholic steatohepatitis)    OA (osteoarthritis)    Obesity    Osteopenia    Prediabetes    Sacroiliitis (HCC)    Sinusitis    Vitamin B12 deficiency    Vitamin D deficiency      Objective: Physical Exam General: The patient is alert and oriented x3 in no acute distress.  Dermatology: Skin is warm, dry and supple bilateral lower extremities. Negative for open lesions or macerations bilateral.   Vascular: Dorsalis Pedis and Posterior Tibial pulses palpable bilateral.  Capillary fill time is immediate to all digits.  Neurological: Epicritic and protective threshold intact bilateral.   Musculoskeletal: Tenderness to palpation to the plantar aspect of the bilateral heels along the plantar fascia. All other joints range of motion within normal limits bilateral. Strength 5/5 in all groups bilateral.    Assessment: 1. plantar fasciitis bilateral feet  Plan of Care:  1. Patient evaluated.  2.  Overall the patient has significant improvement.  The patient has history of CKD.  Only recommend meloxicam intermittently as needed 3.  Recommend OTC Tylenol daily 4.  Continue wearing good supportive shoes 5.  Return to clinic as  needed  *[place]  Edrick Kins, DPM [facility]  Dr. Edrick Kins, DPM    [address], [place] [address]                Office [phone]  Fax [phone]

## 2021-02-27 NOTE — Patient Instructions (Signed)
Seborrheic Keratosis A seborrheic keratosis is a common, noncancerous (benign) skin growth. These growths are velvety, waxy, rough, tan, brown, or black spots that appear on the skin. These skin growths can be flat or raised, andscaly. What are the causes? The cause of this condition is not known. What increases the risk? You are more likely to develop this condition if you: Have a family history of seborrheic keratosis. Are 50 or older. Are pregnant. Have had estrogen replacement therapy. What are the signs or symptoms? Symptoms of this condition include growths on the face, chest, shoulders, back, or other areas. These growths: Are usually painless, but may become irritated and itchy. Can be yellow, brown, black, or other colors. Are slightly raised or have a flat surface. Are sometimes rough or wart-like in texture. Are often velvety or waxy on the surface. Are round or oval-shaped. Often occur in groups, but may occur as a single growth. How is this diagnosed? This condition is diagnosed with a medical history and physical exam. A sample of the growth may be tested (skin biopsy). You may need to see a skin specialist (dermatologist). How is this treated? Treatment is not usually needed for this condition, unless the growths are irritated or bleed often. You may also choose to have the growths removed if you do not like their appearance. Most commonly, these growths are treated with a procedure in which liquid nitrogen is applied to "freeze" off the growth (cryosurgery). They may also be burned off with electricity (electrocautery) or removed by scraping (curettage). Follow these instructions at home: Watch your growth for any changes. Keep all follow-up visits as told by your health care provider. This is important. Do not scratch or pick at the growth or growths. This can cause them to become irritated or infected. Contact a health care provider if: You suddenly have many new  growths. Your growth bleeds, itches, or hurts. Your growth suddenly becomes larger or changes color. Summary A seborrheic keratosis is a common, noncancerous (benign) skin growth. Treatment is not usually needed for this condition, unless the growths are irritated or bleed often. Watch your growth for any changes. Contact a health care provider if you suddenly have many new growths or your growth suddenly becomes larger or changes color. Keep all follow-up visits as told by your health care provider. This is important. This information is not intended to replace advice given to you by your health care provider. Make sure you discuss any questions you have with your healthcare provider. Document Revised: 10/15/2017 Document Reviewed: 10/15/2017 Elsevier Patient Education  2022 Elsevier Inc.  

## 2021-03-10 ENCOUNTER — Encounter: Payer: Self-pay | Admitting: Dermatology

## 2021-03-10 NOTE — Progress Notes (Signed)
   Follow-Up Visit   Subjective  Kristine Martinez is a 65 y.o. female who presents for the following: Annual Exam (Here to check right cheek. PCP thinks it could be a skin cancer. X 1 year. No bleeding but non healing. No history of skin cancer. ).  General skin examination, concern about crust on ranging Location:  Duration:  Quality:  Associated Signs/Symptoms: Modifying Factors:  Severity:  Timing: Context:   Objective  Well appearing patient in no apparent distress; mood and affect are within normal limits. Torso - Posterior (Back) Full body skin examination: No atypical pigmented lesions, no definite nonmelanoma skin cancer.  Right Forearm - Posterior Multiple 1 mm smooth dermal papules  Left Shoulder 5 mm light brown textured papule in  Right Buccal Cheek Gritty pink 5 mm i gritty crust, favor actinic keratosis over superficial carcinoma.     Right Axilla 2 mm pedunculated flesh-colored papule    A full examination was performed including scalp, head, eyes, ears, nose, lips, neck, chest, axillae, abdomen, back, buttocks, bilateral upper extremities, bilateral lower extremities, hands, feet, fingers, toes, fingernails, and toenails. All findings within normal limits unless otherwise noted below.  Areas beneath undergarments not fully examined.   Assessment & Plan    Encounter for screening for malignant neoplasm of skin Torso - Posterior (Back)  Annual skin examination, encouraged to self examine twice annually.  Continue ultraviolet protection.  Hemangioma, unspecified site Right Forearm - Posterior  No intervention necessary  Seborrheic keratosis Left Shoulder  Leave if stable  AK (actinic keratosis) Right Buccal Cheek  If this does not clear with LN2 freeze, will return to consider biopsy.  Destruction of lesion - Right Buccal Cheek Complexity: simple   Destruction method: electrodesiccation and curettage   Informed consent: discussed and consent  obtained   Timeout:  patient name, date of birth, surgical site, and procedure verified Anesthesia: the lesion was anesthetized in a standard fashion   Anesthetic:  1% lidocaine w/ epinephrine 1-100,000 local infiltration Curettage performed in three different directions: Yes   Curettage cycles:  3 Hemostasis achieved with:  aluminum chloride Outcome: patient tolerated procedure well with no complications   Post-procedure details: wound care instructions given    Skin tag Right Axilla  No intervention necessary      I, Lavonna Monarch, MD, have reviewed all documentation for this visit.  The documentation on 03/10/21 for the exam, diagnosis, procedures, and orders are all accurate and complete.

## 2021-03-20 ENCOUNTER — Ambulatory Visit: Payer: Medicare Other | Admitting: Interventional Cardiology

## 2021-04-16 ENCOUNTER — Other Ambulatory Visit: Payer: Self-pay | Admitting: Psychiatry

## 2021-04-16 DIAGNOSIS — F3342 Major depressive disorder, recurrent, in full remission: Secondary | ICD-10-CM

## 2021-04-23 ENCOUNTER — Other Ambulatory Visit: Payer: Self-pay | Admitting: Podiatry

## 2021-04-29 ENCOUNTER — Other Ambulatory Visit: Payer: Self-pay

## 2021-04-29 ENCOUNTER — Encounter: Payer: Self-pay | Admitting: Interventional Cardiology

## 2021-04-29 ENCOUNTER — Ambulatory Visit (INDEPENDENT_AMBULATORY_CARE_PROVIDER_SITE_OTHER): Payer: Medicare Other | Admitting: Interventional Cardiology

## 2021-04-29 VITALS — BP 110/70 | HR 97 | Ht 63.0 in

## 2021-04-29 DIAGNOSIS — R002 Palpitations: Secondary | ICD-10-CM | POA: Diagnosis not present

## 2021-04-29 DIAGNOSIS — E039 Hypothyroidism, unspecified: Secondary | ICD-10-CM | POA: Diagnosis not present

## 2021-04-29 DIAGNOSIS — E782 Mixed hyperlipidemia: Secondary | ICD-10-CM

## 2021-04-29 DIAGNOSIS — R0602 Shortness of breath: Secondary | ICD-10-CM | POA: Diagnosis not present

## 2021-04-29 DIAGNOSIS — R7303 Prediabetes: Secondary | ICD-10-CM

## 2021-04-29 DIAGNOSIS — R072 Precordial pain: Secondary | ICD-10-CM

## 2021-04-29 NOTE — Patient Instructions (Signed)
Medication Instructions:  Your physician recommends that you continue on your current medications as directed. Please refer to the Current Medication list given to you today.  *If you need a refill on your cardiac medications before your next appointment, please call your pharmacy*   Lab Work: none If you have labs (blood work) drawn today and your tests are completely normal, you will receive your results only by: [place] (if you have MyChart) OR A paper copy in the mail If you have any lab test that is abnormal or we need to change your treatment, we will call you to review the results.   Testing/Procedures: Your physician has requested that you have an echocardiogram. Echocardiography is a painless test that uses sound waves to create images of your heart. It provides your doctor with information about the size and shape of your heart and how well your heart's chambers and valves are working. This procedure takes approximately one hour. There are no restrictions for this procedure.  Dr Irish Lack recommends you have a Calcium Score CT scan   Follow-Up: At [facility], you and your health needs are our priority.  As part of our continuing mission to provide you with exceptional heart care, we have created designated Provider Care Teams.  These Care Teams include your primary Cardiologist (physician) and Advanced Practice Providers (APPs -  Physician Assistants and Nurse Practitioners) who all work together to provide you with the care you need, when you need it.  We recommend signing up for the patient portal called "MyChart".  Sign up information is provided on this After Visit Summary.  MyChart is used to connect with patients for Virtual Visits (Telemedicine).  Patients are able to view lab/test results, encounter notes, upcoming appointments, etc.  Non-urgent messages can be sent to your provider as well.   To learn more about what you can do with MyChart, go to  [email or web address].    Your next appointment:   As needed  The format for your next appointment:   In Person  Provider:   Larae Grooms, MD v    Other Instructions High-Fiber Eating Plan Fiber, also called dietary fiber, is a type of carbohydrate. It is found foods such as fruits, vegetables, whole grains, and beans. A high-fiber diet can have many health benefits. Your health care provider may recommend a high-fiber diet to help: Prevent constipation. Fiber can make your bowel movements more regular. Lower your cholesterol. Relieve the following conditions: Inflammation of veins in the anus (hemorrhoids). Inflammation of specific areas of the digestive tract (uncomplicated diverticulosis). A problem of the large intestine, also called the colon, that sometimes causes pain and diarrhea (irritable bowel syndrome, or IBS). Prevent overeating as part of a weight-loss plan. Prevent heart disease, type 2 diabetes, and certain cancers. What are tips for following this plan? Reading food labels  Check the nutrition facts label on food products for the amount of dietary fiber. Choose foods that have 5 grams of fiber or more per serving. The goals for recommended daily fiber intake include: Men (age 52 or younger): 34-38 g. Men (over age 59): 28-34 g. Women (age 37 or younger): 25-28 g. Women (over age 55): 22-25 g. Your daily fiber goal is _____________ g. Shopping Choose whole fruits and vegetables instead of processed forms, such as apple juice or applesauce. Choose a wide variety of high-fiber foods such as avocados, lentils, oats, and kidney beans. Read the nutrition facts label of the foods you choose. Be aware of  foods with added fiber. These foods often have high sugar and sodium amounts per serving. Cooking Use whole-grain flour for baking and cooking. Cook with brown rice instead of white rice. Meal planning Start the day with a breakfast that is high in fiber, such  as a cereal that contains 5 g of fiber or more per serving. Eat breads and cereals that are made with whole-grain flour instead of refined flour or white flour. Eat brown rice, bulgur wheat, or millet instead of white rice. Use beans in place of meat in soups, salads, and pasta dishes. Be sure that half of the grains you eat each day are whole grains. General information You can get the recommended daily intake of dietary fiber by: Eating a variety of fruits, vegetables, grains, nuts, and beans. Taking a fiber supplement if you are not able to take in enough fiber in your diet. It is better to get fiber through food than from a supplement. Gradually increase how much fiber you consume. If you increase your intake of dietary fiber too quickly, you may have bloating, cramping, or gas. Drink plenty of water to help you digest fiber. Choose high-fiber snacks, such as berries, raw vegetables, nuts, and popcorn. What foods should I eat? Fruits Berries. Pears. Apples. Oranges. Avocado. Prunes and raisins. Dried figs. Vegetables Sweet potatoes. Spinach. Kale. Artichokes. Cabbage. Broccoli. Cauliflower. Green peas. Carrots. Squash. Grains Whole-grain breads. Multigrain cereal. Oats and oatmeal. Brown rice. Barley. Bulgur wheat. [place]. Quinoa. Bran muffins. Popcorn. Rye wafer crackers. Meats and other proteins Navy beans, kidney beans, and pinto beans. Soybeans. Split peas. Lentils. Nuts and seeds. Dairy Fiber-fortified yogurt. Beverages Fiber-fortified soy milk. Fiber-fortified orange juice. Other foods Fiber bars. The items listed above may not be a complete list of recommended foods and beverages. Contact a dietitian for more information. What foods should I avoid? Fruits Fruit juice. Cooked, strained fruit. Vegetables Fried potatoes. Canned vegetables. Well-cooked vegetables. Grains White bread. Pasta made with refined flour. White rice. Meats and other proteins Fatty cuts of meat.  Fried chicken or fried fish. Dairy Milk. Yogurt. Cream cheese. Sour cream. Fats and oils Butters. Beverages Soft drinks. Other foods Cakes and pastries. The items listed above may not be a complete list of foods and beverages to avoid. Talk with your dietitian about what choices are best for you. Summary Fiber is a type of carbohydrate. It is found in foods such as fruits, vegetables, whole grains, and beans. A high-fiber diet has many benefits. It can help to prevent constipation, lower blood cholesterol, aid weight loss, and reduce your risk of heart disease, diabetes, and certain cancers. Increase your intake of fiber gradually. Increasing fiber too quickly may cause cramping, bloating, and gas. Drink plenty of water while you increase the amount of fiber you consume. The best sources of fiber include whole fruits and vegetables, whole grains, nuts, seeds, and beans. This information is not intended to replace advice given to you by your health care provider. Make sure you discuss any questions you have with your health care provider. Document Revised: 10/06/2019 Document Reviewed: 10/06/2019 Elsevier Patient Education  2022 [employer].

## 2021-04-29 NOTE — Progress Notes (Signed)
Cardiology Office Note   Date:  04/29/2021   ID:  Demetris, Meinhardt 07-Dec-1955, MRN [id number]  PCP:  Lawerance Cruel, MD    No chief complaint on file.  Palpitations  Wt Readings from Last 3 Encounters:  01/16/14 204 lb (92.5 kg)       History of Present Illness: Kristine Martinez is a 65 y.o. female who is being seen today for the evaluation of palpitations at the request of Lawerance Cruel, MD.   According to Dr. Alan Ripper note from March 2022, her smart watch was showing a heart rate over 120.  She was having some associated shortness of breath.  She has cancelled several appts due to family stress per her report.    THe last episode of palpitations was 4 months ago.  She has decreased caffeine intake for religious reasons.  She has had some SHOB.  She has knee arthritis that limits walking. No prior cardiac w/u.  No family h/o CAD.   Denies : Chest pain. Dizziness. Leg edema. Nitroglycerin use. Orthopnea.  Paroxysmal nocturnal dyspnea.  Syncope.      Past Medical History:  Diagnosis Date   Allergic rhinitis    Attention deficit disorder (ADD) without hyperactivity    Chronic kidney disease (CKD), active medical management without dialysis, stage 3 (moderate) (HCC)    Depression    Gastro-esophageal reflux disease without esophagitis    High cholesterol    Hypothyroidism    Migraine with aura    NASH (nonalcoholic steatohepatitis)    OA (osteoarthritis)    Obesity    Osteopenia    Prediabetes    Sacroiliitis (HCC)    Sinusitis    Vitamin B12 deficiency    Vitamin D deficiency     No past surgical history on file.   Current Outpatient Medications  Medication Sig Dispense Refill   ARIPiprazole (ABILIFY) 5 MG tablet TAKE 1 TABLET DAILY 90 tablet 0   Calcium Carbonate-Vitamin D (CALTRATE 600+D PO) Take by mouth.     calcium citrate (CALCITRATE - DOSED IN MG ELEMENTAL CALCIUM) 950 (200 Ca) MG tablet Take by mouth.     calcium citrate-vitamin D  (CITRACAL+D) 315-200 MG-UNIT tablet Take 1 tablet by mouth 2 (two) times daily.     hydrOXYzine (ATARAX/VISTARIL) 25 MG tablet TAKE 1 TABLET(25 MG) BY MOUTH AT BEDTIME AS NEEDED 90 tablet 0   levothyroxine (SYNTHROID) 100 MCG tablet Take 1 tablet (100 mcg total) by mouth daily before breakfast. 90 tablet 1   meloxicam (MOBIC) 15 MG tablet TAKE 1 TABLET(15 MG) BY MOUTH DAILY AS NEEDED FOR PAIN 30 tablet 1   methylphenidate (CONCERTA) 36 MG PO CR tablet Take 2 tablets (72 mg total) by mouth daily. 180 tablet 0   methylphenidate 36 MG PO CR tablet methylphenidate ER 36 mg tablet,extended release 24 hr     omeprazole (PRILOSEC OTC) 20 MG tablet Take 20 mg by mouth daily.     pantoprazole (PROTONIX) 40 MG tablet Take 1 tablet (40 mg total) by mouth daily. 90 tablet 1   temazepam (RESTORIL) 30 MG capsule TAKE 1 CAPSULE(30 MG) BY MOUTH AT BEDTIME 90 capsule 1   thyroid (ARMOUR) 60 MG tablet      Vilazodone HCl (VIIBRYD) 40 MG TABS TAKE 1 TABLET DAILY 90 tablet 0   No current facility-administered medications for this visit.    Allergies:   Patient has no known allergies.    Social History:  The patient  reports  that she has never smoked. She has never used smokeless tobacco.   Family History:  The patient's family history is not on file.    ROS:  Please see the history of present illness.   Otherwise, review of systems are positive for difficulty losing weight.   All other systems are reviewed and negative.    PHYSICAL EXAM: VS:  BP 110/70   Pulse 97   Ht 5\' 3"  (1.6 m)   SpO2 95%   BMI 36.14 kg/m  , BMI Body mass index is 36.14 kg/m. GEN: Well nourished, well developed, in no acute distress HEENT: normal Neck: no JVD, carotid bruits, or masses Cardiac: RRR; no murmurs, rubs, or gallops,no edema  Respiratory:  clear to auscultation bilaterally, normal work of breathing GI: soft, nontender, nondistended, + BS, obese MS: no deformity or atrophy Skin: warm and dry, no rash Neuro:   Strength and sensation are intact Psych: euthymic mood, full affect   EKG:   The ekg ordered today demonstrates NSR, no ST changes, heart rate at upper limits of normal   Recent Labs: No results found for requested labs within last 8760 hours.   Lipid Panel No results found for: CHOL, TRIG, HDL, CHOLHDL, VLDL, LDLCALC, LDLDIRECT   Other studies Reviewed: Additional studies/ records that were reviewed today with results demonstrating: 2017 abd CT showed no atherosclerosis.   ASSESSMENT AND PLAN:  Palpitations: Sx have decreased.  She was under a lot of stress at the time of the palpitations.  This has improved.  She has also cut back significantly on caffeine intake in the past year.  She was drinking 7-8 sodas per day but has now stopped completely for religious reasons.  She is [employer].  She is also on some medications for ADD that could potentially cause tachycardia. Hypothyroidism: TSH controlled in July 2022. DOE: Plan for echo to evaluate for structural heart disease. Patient declined her weight to be checked. Hyperlipidemia: LDL 155.  No taking cholesterol meds.  Admits to poor diet.  High-fiber diet.  Plan for calcium scoring CT scan.   She is agreeable.  preDM: Whole food, plant-based diet.  Avoid processed foods.  Avoid concentrated sweets.   Current medicines are reviewed at length with the patient today.  The patient concerns regarding her medicines were addressed.  The following changes have been made:  No change  Labs/ tests ordered today include:  No orders of the defined types were placed in this encounter.   Recommend 150 minutes/week of aerobic exercise Low fat, low carb, high fiber diet recommended  Disposition:   FU based on test results   Signed, Larae Grooms, MD  04/29/2021 8:55 AM    [facility] [address], [place], [place]  [address] Phone: [phone]; Fax: [phone]

## 2021-05-21 ENCOUNTER — Other Ambulatory Visit: Payer: Self-pay

## 2021-05-21 ENCOUNTER — Encounter: Payer: Self-pay | Admitting: Psychiatry

## 2021-05-21 ENCOUNTER — Ambulatory Visit (INDEPENDENT_AMBULATORY_CARE_PROVIDER_SITE_OTHER): Payer: Medicare Other | Admitting: Psychiatry

## 2021-05-21 VITALS — BP 109/76 | HR 96

## 2021-05-21 DIAGNOSIS — F5105 Insomnia due to other mental disorder: Secondary | ICD-10-CM | POA: Diagnosis not present

## 2021-05-21 DIAGNOSIS — Z6379 Other stressful life events affecting family and household: Secondary | ICD-10-CM | POA: Diagnosis not present

## 2021-05-21 DIAGNOSIS — F900 Attention-deficit hyperactivity disorder, predominantly inattentive type: Secondary | ICD-10-CM | POA: Diagnosis not present

## 2021-05-21 DIAGNOSIS — F3342 Major depressive disorder, recurrent, in full remission: Secondary | ICD-10-CM | POA: Diagnosis not present

## 2021-05-21 MED ORDER — ARIPIPRAZOLE 5 MG PO TABS: 5.0000 mg | ORAL_TABLET | Freq: Every day | ORAL | 1 refills | Status: DC

## 2021-05-21 MED ORDER — TEMAZEPAM 30 MG PO CAPS: ORAL_CAPSULE | ORAL | 1 refills | Status: DC

## 2021-05-21 MED ORDER — METHYLPHENIDATE HCL ER (OSM) 36 MG PO TBCR: 72.0000 mg | EXTENDED_RELEASE_TABLET | Freq: Every day | ORAL | 0 refills | Status: DC

## 2021-05-21 MED ORDER — HYDROXYZINE HCL 25 MG PO TABS: ORAL_TABLET | ORAL | 1 refills | Status: DC

## 2021-05-21 MED ORDER — VILAZODONE HCL 40 MG PO TABS: 40.0000 mg | ORAL_TABLET | Freq: Every day | ORAL | 1 refills | Status: DC

## 2021-05-21 NOTE — Patient Instructions (Signed)
Coverage app for phone

## 2021-05-21 NOTE — Progress Notes (Signed)
[name] [id number] 17-Jul-1955 65 y.o.  Subjective:   Patient ID:  Kristine Martinez is a 65 y.o. (DOB 1955-08-21) female.  Chief Complaint:  Chief Complaint  Patient presents with   Follow-up   Depression   ADHD   Stress    [name] presents to the office today for follow-up of ADD and depression.   seen August 19, 2018.  Because of concerns about attention and focus Concerta was increased to 36 mg every morning.  The other medications were continued without change. Doesn't get tasks done.  Can't make myself do it.  Denies feeling depressed despite stressors.  Enjoys gkids and reading. Trouble staying focused.  Wants to increase Concerta.  seen March 16, 2019.  She wanted to increase the Concerta therefore was increased to 54 mg daily.  The other meds were left unchanged. Generally Good depression and anxiety response with Viibryd 40 + Abilify 5.  No SE  seen June 22, 2019.  She was doing okay with regard to mood and anxiety but did not notice sufficient benefit with the increase in Concerta to 54 mg daily.  Therefore the dosage was increased to 72 mg daily. Increase in Concerta noticed a little change but not as much as she'd like to be with productivity.  It's a little better.  GKids staying with her with son temporarily.    Patient reports stable mood and denies depressed or irritable moods.  Patient denies any recent difficulty with anxiety.  Patient denies difficulty with sleep initiation or maintenance but recent anniversary nightmares.  Overall fewer.  7-8 hours.  Otherwise Restoril helps.  Has awoken screaming.  This is unusual.  Been watching murder mysteries. Denies appetite disturbance.  Patient reports that energy and motivation have been good.    Patient denies any suicidal ideation. Plan no med changes.  04/19/20 appt with following noted: Medicare in Feb.  Picked plan and won't cover Concerta nor Viibryd.  Disc this at length.   Still doing well with the meds.   Pleased and doesn't want to change.  Holidays are hard and doesn't want to change at holidays. Plan: BC partial response continue Concerta 72 mg daily Good depression and anxiety response with Viibryd 40 + Abilify 5.   08/14/2020 appointment with following noted: Disc insurance problems with hydroxyzine and disc reasons why they give a hassle about it.  Needs it to help sleep.  Generally sleep good with occ bad dreams. Still good mood and pleased with meds.  Able to stay on Viibryd and Concerta now that turned 64 yo. Foggy tired and can't function with out Concerta.  Helps focus and productivity. Questions about how to get refills.   3 alcoholic sons.  One got rehab and is sober.  One ruined Public house manager. Active [employer] helps. Quit Pepsi and Mtn Dew. Patient reports stable mood and denies depressed or irritable moods.  Patient denies any recent difficulty with anxiety.  Patient denies difficulty with sleep initiation or maintenance. Denies appetite disturbance.  Patient reports that energy and motivation have been good.  Patient denies any difficulty with concentration.  Patient denies any suicidal ideation.  01/14/2021 appt noted: Need another insurance company.  Mail lost $500 dollars of meds and problems with [facility]. Doesn't want to change from [place]. Meds working for her and doesn't want change.   Disc insurance problems with hydroxyzine and disc reasons why they give a hassle about it.  Needs it to help sleep.  Patient reports stable mood  and denies depressed or irritable moods.  Patient denies any recent difficulty with anxiety.  Patient denies difficulty with sleep initiation or maintenance. Denies appetite disturbance.  Patient reports that energy and motivation have been good.  Patient denies any difficulty with concentration.  Patient denies any suicidal ideation. Doing well with stimulants. No SE Repeat liver enzymes normal Plan: no med changes  05/21/21 appt noted: Continues  med. Stressed bc living with 2 alcoholic sons.  Pray a lot and stay in room a lot. Started counseling helpful twice. Continues viibryd 40, Abilify 5 mg, temazepam 30 HS,Hydroxyzine 25 HS, concerta 36 mg AM No SE. Patient reports stable mood and denies depressed or irritable moods.  Patient denies any recent difficulty with anxiety but stressed..  Patient denies difficulty with sleep initiation or maintenance. Denies appetite disturbance.  Patient reports that energy and motivation have been good.  Patient denies any difficulty with concentration.  Patient denies any suicidal ideation.   Past Psychiatric Medication Trials: Vyvanse, Concerta 72,  Ambien side effects, trazodone, prazosin, hydroxyzine, Lunesta, temazepam 30 Viibryd, duloxetine 90, Wellbutrin 450, fluoxetine,   Review of Systems:  Review of Systems  Cardiovascular:  Negative for chest pain and palpitations.  Musculoskeletal:  Positive for arthralgias and gait problem.  Neurological:  Negative for dizziness, tremors and weakness.  Psychiatric/Behavioral:  Positive for decreased concentration. Negative for agitation, behavioral problems, confusion, dysphoric mood, hallucinations, self-injury, sleep disturbance and suicidal ideas. The patient is not nervous/anxious and is not hyperactive.  Normal BP  Medications: I have reviewed the patient's current medications.  Current Outpatient Medications  Medication Sig Dispense Refill   Calcium Carbonate-Vitamin D (CALTRATE 600+D PO) Take by mouth.     calcium citrate-vitamin D (CITRACAL+D) 315-200 MG-UNIT tablet Take 1 tablet by mouth 2 (two) times daily.     levothyroxine (SYNTHROID) 100 MCG tablet Take 1 tablet (100 mcg total) by mouth daily before breakfast. 90 tablet 1   methylphenidate 36 MG PO CR tablet methylphenidate ER 36 mg tablet,extended release 24 hr     pantoprazole (PROTONIX) 40 MG tablet Take 1 tablet (40 mg total) by mouth daily. 90 tablet 1   thyroid (ARMOUR) 60 MG  tablet      ARIPiprazole (ABILIFY) 5 MG tablet Take 1 tablet (5 mg total) by mouth daily. 90 tablet 1   hydrOXYzine (ATARAX) 25 MG tablet TAKE 1 TABLET(25 MG) BY MOUTH AT BEDTIME AS NEEDED 90 tablet 1   meloxicam (MOBIC) 15 MG tablet TAKE 1 TABLET(15 MG) BY MOUTH DAILY AS NEEDED FOR PAIN (Patient not taking: Reported on 05/21/2021) 30 tablet 1   methylphenidate (CONCERTA) 36 MG PO CR tablet Take 2 tablets (72 mg total) by mouth daily. 180 tablet 0   omeprazole (PRILOSEC OTC) 20 MG tablet Take 20 mg by mouth daily. (Patient not taking: Reported on 05/21/2021)     temazepam (RESTORIL) 30 MG capsule TAKE 1 CAPSULE(30 MG) BY MOUTH AT BEDTIME 90 capsule 1   Vilazodone HCl (VIIBRYD) 40 MG TABS Take 1 tablet (40 mg total) by mouth daily. 90 tablet 1   No current facility-administered medications for this visit.    Medication Side Effects: None  Allergies: No Known Allergies  Past Medical History:  Diagnosis Date   Allergic rhinitis    Attention deficit disorder (ADD) without hyperactivity    Chronic kidney disease (CKD), active medical management without dialysis, stage 3 (moderate) (HCC)    Depression    Gastro-esophageal reflux disease without esophagitis    High cholesterol  Hypothyroidism    Migraine with aura    NASH (nonalcoholic steatohepatitis)    OA (osteoarthritis)    Obesity    Osteopenia    Prediabetes    Sacroiliitis (HCC)    Sinusitis    Vitamin B12 deficiency    Vitamin D deficiency     Family History  Problem Relation Age of Onset   Alzheimer's disease Maternal Aunt     Social History   Socioeconomic History   Marital status: Widowed    Spouse name: Not on file   Number of children: Not on file   Years of education: Not on file   Highest education level: Not on file  Occupational History   Not on file  Tobacco Use   Smoking status: Never   Smokeless tobacco: Never  Substance and Sexual Activity   Alcohol use: Not on file   Drug use: Not on file    Sexual activity: Not on file  Other Topics Concern   Not on file  Social History Narrative   Not on file   Social Determinants of Health   Financial Resource Strain: Not on file  Food Insecurity: Not on file  Transportation Needs: Not on file  Physical Activity: Not on file  Stress: Not on file  Social Connections: Not on file  Intimate Partner Violence: Not on file    Past Medical History, Surgical history, Social history, and Family history were reviewed and updated as appropriate.   Please see review of systems for further details on the patient's review from today.   Objective:   Physical Exam:  BP 109/76   Pulse 96   Physical Exam Constitutional:      General: She is not in acute distress.    Appearance: She is well-developed. She is obese.  Musculoskeletal:        General: No deformity.  Neurological:     Mental Status: She is alert and oriented to person, place, and time.     Motor: No tremor.     Coordination: Coordination normal.     Gait: Gait normal.  Psychiatric:        Attention and Perception: She is attentive.        Mood and Affect: Mood is not anxious or depressed. Affect is not labile, blunt, angry, tearful or inappropriate.        Speech: Speech normal.        Behavior: Behavior normal. Behavior is not slowed.        Thought Content: Thought content normal. Thought content is not delusional. Thought content does not include homicidal or suicidal ideation.        Cognition and Memory: Cognition normal.        Judgment: Judgment normal.     Comments: Insight is good. Stressed with sons.    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 We discussed Educated difference between anhedonic depression with poor attention and ADHD and overlap of sx in detail.  She doesn't believe she's depressed.  Disc drug pricing for generics and GoodRx.  Extensived Disc differnence between insurance plans and coverage of meds. Meds are unique and there aren't cheap alternatives. Viibryd is expensive but in process of becoming generic.  Checked pricing with her.  BC partial response continue Concerta 72 mg daily. Tolerating last increase. BP and pulse were OK. Can't mange TID dosing of stimulants. Discussed potential benefits, risks, and side effects of stimulants with patient to include increased heart rate, palpitations, insomnia, increased anxiety, increased irritability, or decreased appetite.  Instructed patient to contact office if experiencing any significant tolerability issues.  Good depression and anxiety response with Viibryd 40 + Abilify 5.  No SE  Tendency towards anxiety much better.  Discussed potential metabolic side effects associated with atypical antipsychotics, as well as potential risk for movement side effects. Advised pt to contact office if movement side effects occur.   Good response  with sleep meds still. Sleep good overall with occ NM.  Not enough to restart NM meds. Hydroxyzine helps but still needs temazepam. Disc normal sleep stages.     Patient has a history of elevated liver enzymes which have returned to normal lately.  She has not had significant weight loss.  Answered questions about meds and elevated liver enzymes as well as her diagnosis of chronic kidney disease stage II or III and psychiatric medications.  She is not taking any psychiatric medications that should affect her kidneys.  No med changes Continues viibryd 40, Abilify 5 mg, temazepam 30 HS,Hydroxyzine 25 HS, concerta 72 mg AM  Fu 6 mos  Lynder Parents, MD, DFAPA    Please see After Visit Summary for patient specific instructions.  Future Appointments  Date Time Provider [place]  05/24/2021  9:15 AM [facility] 3 [id number]  05/24/2021 10:15 AM [facility]-CT 1 [facility]-CT [facility]  06/25/2021  8:30 AM Lavonna [facility], MD [facility]    No orders of the defined types were placed in this encounter.     -------------------------------

## 2021-05-22 ENCOUNTER — Telehealth (HOSPITAL_COMMUNITY): Payer: Self-pay | Admitting: Interventional Cardiology

## 2021-05-22 NOTE — Telephone Encounter (Signed)
Patient cancelled echocardiogram for reason below:  05/21/2021 4:49 PM VT:VNRWC, Jeanette Caprice E  Cancel Rsn: Patient (states something came up with her family/will call back to reschedule)  Order will be removed from the echo [place] and when patient calls back to reschedule we will reinstate the order. Thank you.

## 2021-05-23 NOTE — Telephone Encounter (Signed)
Patient also canceled Calcium Score CT Scan

## 2021-05-24 ENCOUNTER — Other Ambulatory Visit (HOSPITAL_COMMUNITY): Payer: Medicare Other

## 2021-05-24 ENCOUNTER — Other Ambulatory Visit: Payer: Medicare Other

## 2021-06-24 ENCOUNTER — Telehealth: Payer: Self-pay | Admitting: Psychiatry

## 2021-06-24 ENCOUNTER — Other Ambulatory Visit: Payer: Self-pay

## 2021-06-24 DIAGNOSIS — F3342 Major depressive disorder, recurrent, in full remission: Secondary | ICD-10-CM

## 2021-06-24 MED ORDER — VILAZODONE HCL 40 MG PO TABS: 40.0000 mg | ORAL_TABLET | Freq: Every day | ORAL | 1 refills | Status: DC

## 2021-06-24 NOTE — Telephone Encounter (Signed)
Rx sent 

## 2021-06-24 NOTE — Telephone Encounter (Signed)
Pt requesting Rx for Viibryd to [facility]. No longer using [facility]. Any questions call pt @ [phone]

## 2021-06-25 ENCOUNTER — Ambulatory Visit: Payer: Medicare Other | Admitting: Dermatology

## 2021-06-26 ENCOUNTER — Telehealth: Payer: Self-pay | Admitting: Podiatry

## 2021-06-26 ENCOUNTER — Other Ambulatory Visit: Payer: Self-pay

## 2021-06-26 DIAGNOSIS — M722 Plantar fascial fibromatosis: Secondary | ICD-10-CM

## 2021-06-26 NOTE — Telephone Encounter (Signed)
Patient called would like you to fax over referral for [facility] -for PF  therapy  fax # [phone]   She has an appointment for  07/11/21 , but they wont see her if they dont receive a referral from you.

## 2021-07-08 ENCOUNTER — Other Ambulatory Visit: Payer: Self-pay

## 2021-07-08 ENCOUNTER — Ambulatory Visit: Payer: Medicare Other | Admitting: Podiatry

## 2021-07-08 DIAGNOSIS — M722 Plantar fascial fibromatosis: Secondary | ICD-10-CM | POA: Diagnosis not present

## 2021-07-08 DIAGNOSIS — R2689 Other abnormalities of gait and mobility: Secondary | ICD-10-CM | POA: Diagnosis not present

## 2021-07-08 DIAGNOSIS — M79672 Pain in left foot: Secondary | ICD-10-CM | POA: Diagnosis not present

## 2021-07-08 DIAGNOSIS — M79671 Pain in right foot: Secondary | ICD-10-CM | POA: Diagnosis not present

## 2021-07-08 MED ORDER — BETAMETHASONE SOD PHOS & ACET 6 (3-3) MG/ML IJ SUSP
3.0000 mg | Freq: Once | INTRAMUSCULAR | Status: AC
  Administered 2021-07-08: 3 mg via INTRA_ARTICULAR

## 2021-07-08 NOTE — Progress Notes (Signed)
° °  Subjective: 66 y.o. female presenting today for follow-up evaluation of plantar fasciitis to the bilateral feet that is been going on for about 1 year now.  Patient states that she is set up to go to physical therapy at [facility] beginning tomorrow.  She is going to go a few times per week.  She says that slowly over the past few months the chronic heel pain has returned.  She presents for further treatment and evaluation  Past Medical History:  Diagnosis Date   Allergic rhinitis    Attention deficit disorder (ADD) without hyperactivity    Chronic kidney disease (CKD), active medical management without dialysis, stage 3 (moderate) (HCC)    Depression    Gastro-esophageal reflux disease without esophagitis    High cholesterol    Hypothyroidism    Migraine with aura    NASH (nonalcoholic steatohepatitis)    OA (osteoarthritis)    Obesity    Osteopenia    Prediabetes    Sacroiliitis (HCC)    Sinusitis    Vitamin B12 deficiency    Vitamin D deficiency    No past surgical history on file. No Known Allergies   Objective: Physical Exam General: The patient is alert and oriented x3 in no acute distress.  Dermatology: Skin is warm, dry and supple bilateral lower extremities. Negative for open lesions or macerations bilateral.   Vascular: Dorsalis Pedis and Posterior Tibial pulses palpable bilateral.  Capillary fill time is immediate to all digits.  Neurological: Epicritic and protective threshold intact bilateral.   Musculoskeletal: There continues to be tenderness to palpation to the plantar aspect of the bilateral heels along the plantar fascia. All other joints range of motion within normal limits bilateral. Strength 5/5 in all groups bilateral.    Assessment: 1. plantar fasciitis bilateral feet  Plan of Care:  1. Patient evaluated.  2.  Overall the patient has significant improvement.  The patient has history of CKD.  No NSAIDs 3.  Patient takes Tylenol  arthritis, 2 tablets QHS and in the morning 4.  Continue wearing good supportive shoes.  The patient does have some arch supports/insoles OTC that she does say helped.  She also says that she has some copper socks which helps 5.  Return to clinic as needed  *Going to visit her daughter in April in [place], [place]. requesting to get an injection before she leaves if the heel pain returns  Edrick Kins, DPM [facility]  Dr. Edrick Kins, DPM    [address], [place] [address]                Office [phone]  Fax [phone]

## 2021-07-15 ENCOUNTER — Other Ambulatory Visit: Payer: Self-pay | Admitting: Psychiatry

## 2021-07-15 DIAGNOSIS — F3342 Major depressive disorder, recurrent, in full remission: Secondary | ICD-10-CM

## 2021-07-18 ENCOUNTER — Other Ambulatory Visit: Payer: Self-pay

## 2021-07-18 ENCOUNTER — Other Ambulatory Visit: Payer: Self-pay | Admitting: Psychiatry

## 2021-07-18 DIAGNOSIS — M79671 Pain in right foot: Secondary | ICD-10-CM | POA: Diagnosis not present

## 2021-07-18 DIAGNOSIS — M79672 Pain in left foot: Secondary | ICD-10-CM | POA: Diagnosis not present

## 2021-07-18 DIAGNOSIS — M722 Plantar fascial fibromatosis: Secondary | ICD-10-CM | POA: Diagnosis not present

## 2021-07-18 DIAGNOSIS — R2689 Other abnormalities of gait and mobility: Secondary | ICD-10-CM | POA: Diagnosis not present

## 2021-07-18 MED ORDER — LEVOTHYROXINE SODIUM 100 MCG PO TABS: 100.0000 ug | ORAL_TABLET | Freq: Every day | ORAL | 0 refills | Status: DC

## 2021-07-18 NOTE — Telephone Encounter (Signed)
Pt lm that she is no longer using [facility] and wants her refills [facility] instead. It is much cheaper

## 2021-07-24 DIAGNOSIS — M79672 Pain in left foot: Secondary | ICD-10-CM | POA: Diagnosis not present

## 2021-07-24 DIAGNOSIS — R2689 Other abnormalities of gait and mobility: Secondary | ICD-10-CM | POA: Diagnosis not present

## 2021-07-24 DIAGNOSIS — M79671 Pain in right foot: Secondary | ICD-10-CM | POA: Diagnosis not present

## 2021-07-24 DIAGNOSIS — M722 Plantar fascial fibromatosis: Secondary | ICD-10-CM | POA: Diagnosis not present

## 2021-08-01 DIAGNOSIS — R2689 Other abnormalities of gait and mobility: Secondary | ICD-10-CM | POA: Diagnosis not present

## 2021-08-01 DIAGNOSIS — M79672 Pain in left foot: Secondary | ICD-10-CM | POA: Diagnosis not present

## 2021-08-01 DIAGNOSIS — M79671 Pain in right foot: Secondary | ICD-10-CM | POA: Diagnosis not present

## 2021-08-01 DIAGNOSIS — M722 Plantar fascial fibromatosis: Secondary | ICD-10-CM | POA: Diagnosis not present

## 2021-08-08 DIAGNOSIS — M722 Plantar fascial fibromatosis: Secondary | ICD-10-CM | POA: Diagnosis not present

## 2021-08-08 DIAGNOSIS — M79672 Pain in left foot: Secondary | ICD-10-CM | POA: Diagnosis not present

## 2021-08-08 DIAGNOSIS — M79671 Pain in right foot: Secondary | ICD-10-CM | POA: Diagnosis not present

## 2021-08-08 DIAGNOSIS — R2689 Other abnormalities of gait and mobility: Secondary | ICD-10-CM | POA: Diagnosis not present

## 2021-08-12 ENCOUNTER — Other Ambulatory Visit: Payer: Self-pay | Admitting: Psychiatry

## 2021-08-12 DIAGNOSIS — F5105 Insomnia due to other mental disorder: Secondary | ICD-10-CM

## 2021-08-12 MED ORDER — TEMAZEPAM 30 MG PO CAPS: ORAL_CAPSULE | ORAL | 1 refills | Status: DC

## 2021-08-12 NOTE — Telephone Encounter (Signed)
Pt requesting Rx for Temazepam 30 mg  #90 @ [facility]. Has 13 pills left. Out of town 3/6-3/17. Will run out before returning home. Contact # [phone] apt [address]

## 2021-08-22 DIAGNOSIS — R2689 Other abnormalities of gait and mobility: Secondary | ICD-10-CM | POA: Diagnosis not present

## 2021-08-22 DIAGNOSIS — M79672 Pain in left foot: Secondary | ICD-10-CM | POA: Diagnosis not present

## 2021-08-22 DIAGNOSIS — M722 Plantar fascial fibromatosis: Secondary | ICD-10-CM | POA: Diagnosis not present

## 2021-08-22 DIAGNOSIS — M79671 Pain in right foot: Secondary | ICD-10-CM | POA: Diagnosis not present

## 2021-08-29 DIAGNOSIS — M79671 Pain in right foot: Secondary | ICD-10-CM | POA: Diagnosis not present

## 2021-08-29 DIAGNOSIS — R2689 Other abnormalities of gait and mobility: Secondary | ICD-10-CM | POA: Diagnosis not present

## 2021-08-29 DIAGNOSIS — M722 Plantar fascial fibromatosis: Secondary | ICD-10-CM | POA: Diagnosis not present

## 2021-08-29 DIAGNOSIS — M79672 Pain in left foot: Secondary | ICD-10-CM | POA: Diagnosis not present

## 2021-09-02 ENCOUNTER — Telehealth: Payer: Self-pay | Admitting: Psychiatry

## 2021-09-02 DIAGNOSIS — F900 Attention-deficit hyperactivity disorder, predominantly inattentive type: Secondary | ICD-10-CM

## 2021-09-02 MED ORDER — METHYLPHENIDATE HCL ER (OSM) 36 MG PO TBCR: 72.0000 mg | EXTENDED_RELEASE_TABLET | Freq: Every day | ORAL | 0 refills | Status: DC

## 2021-09-02 NOTE — Telephone Encounter (Signed)
Pt gets a 90 day supply through [facility].Since she is using a local pharmacy should I pend a 30 day supply?It was filled 05/28/21

## 2021-09-02 NOTE — Telephone Encounter (Signed)
Pt is leaving to go out of town to [place] and is asking for refill of Methylphenidate '36mg'$  90# be sent to [facility] before Saturday.  She has 5 days left. ? ?Next appt 6/6 ?

## 2021-09-02 NOTE — Telephone Encounter (Signed)
Yes pend RX ?

## 2021-09-04 ENCOUNTER — Other Ambulatory Visit: Payer: Self-pay | Admitting: Psychiatry

## 2021-09-04 DIAGNOSIS — F900 Attention-deficit hyperactivity disorder, predominantly inattentive type: Secondary | ICD-10-CM

## 2021-09-04 MED ORDER — METHYLPHENIDATE HCL ER (OSM) 36 MG PO TBCR: 72.0000 mg | EXTENDED_RELEASE_TABLET | Freq: Every day | ORAL | 0 refills | Status: DC

## 2021-09-04 NOTE — Telephone Encounter (Signed)
Please approve

## 2021-09-04 NOTE — Telephone Encounter (Signed)
Kristine Martinez again call this morning to check on status of the refill of her Concerta. She needs to be able to pick it up by Friday so she will have it before she leaves for [place].  It is a little early but knows she can't get it in [place].  She is using [facility] for this prescription because it is cheaper using [facility] at [facility].  Again send to the [facility]  on [address] by [place].  Appt 6/6 ?

## 2021-09-04 NOTE — Telephone Encounter (Signed)
I do not see that you pended it.  But I sent it ?

## 2021-09-05 ENCOUNTER — Other Ambulatory Visit: Payer: Self-pay

## 2021-09-05 DIAGNOSIS — M79671 Pain in right foot: Secondary | ICD-10-CM | POA: Diagnosis not present

## 2021-09-05 DIAGNOSIS — M79672 Pain in left foot: Secondary | ICD-10-CM | POA: Diagnosis not present

## 2021-09-05 DIAGNOSIS — M722 Plantar fascial fibromatosis: Secondary | ICD-10-CM | POA: Diagnosis not present

## 2021-09-05 DIAGNOSIS — F900 Attention-deficit hyperactivity disorder, predominantly inattentive type: Secondary | ICD-10-CM

## 2021-09-05 DIAGNOSIS — R2689 Other abnormalities of gait and mobility: Secondary | ICD-10-CM | POA: Diagnosis not present

## 2021-09-05 MED ORDER — METHYLPHENIDATE HCL ER (OSM) 36 MG PO TBCR: 72.0000 mg | EXTENDED_RELEASE_TABLET | Freq: Every day | ORAL | 0 refills | Status: DC

## 2021-09-05 NOTE — Telephone Encounter (Signed)
Kristine Martinez went to pick up her Concerta and the pharmacy didn't have the medication due to shortage.  She found it at [facility] at [address].  Please send the prescription there.  She did they told her they had enough for #90 BUT other people were in front of her.  Please send as soon as you can and let her know so she hopefully get there to pick it up before others.  Again, she needs to get it by tomorrow because she is leaving town. ?

## 2021-09-19 DIAGNOSIS — M79672 Pain in left foot: Secondary | ICD-10-CM | POA: Diagnosis not present

## 2021-09-19 DIAGNOSIS — R2689 Other abnormalities of gait and mobility: Secondary | ICD-10-CM | POA: Diagnosis not present

## 2021-09-19 DIAGNOSIS — M722 Plantar fascial fibromatosis: Secondary | ICD-10-CM | POA: Diagnosis not present

## 2021-09-19 DIAGNOSIS — M79671 Pain in right foot: Secondary | ICD-10-CM | POA: Diagnosis not present

## 2021-09-25 DIAGNOSIS — M1712 Unilateral primary osteoarthritis, left knee: Secondary | ICD-10-CM | POA: Diagnosis not present

## 2021-09-25 DIAGNOSIS — M722 Plantar fascial fibromatosis: Secondary | ICD-10-CM | POA: Diagnosis not present

## 2021-09-25 DIAGNOSIS — M79671 Pain in right foot: Secondary | ICD-10-CM | POA: Diagnosis not present

## 2021-09-25 DIAGNOSIS — M79672 Pain in left foot: Secondary | ICD-10-CM | POA: Diagnosis not present

## 2021-09-25 DIAGNOSIS — R2689 Other abnormalities of gait and mobility: Secondary | ICD-10-CM | POA: Diagnosis not present

## 2021-09-25 DIAGNOSIS — M170 Bilateral primary osteoarthritis of knee: Secondary | ICD-10-CM | POA: Diagnosis not present

## 2021-09-25 DIAGNOSIS — M1711 Unilateral primary osteoarthritis, right knee: Secondary | ICD-10-CM | POA: Diagnosis not present

## 2021-09-30 ENCOUNTER — Ambulatory Visit: Payer: Medicare Other | Admitting: Podiatry

## 2021-10-01 DIAGNOSIS — M1711 Unilateral primary osteoarthritis, right knee: Secondary | ICD-10-CM | POA: Diagnosis not present

## 2021-10-01 DIAGNOSIS — M170 Bilateral primary osteoarthritis of knee: Secondary | ICD-10-CM | POA: Diagnosis not present

## 2021-10-02 ENCOUNTER — Other Ambulatory Visit: Payer: Self-pay | Admitting: Psychiatry

## 2021-10-02 DIAGNOSIS — F5105 Insomnia due to other mental disorder: Secondary | ICD-10-CM

## 2021-10-03 DIAGNOSIS — R2689 Other abnormalities of gait and mobility: Secondary | ICD-10-CM | POA: Diagnosis not present

## 2021-10-03 DIAGNOSIS — M722 Plantar fascial fibromatosis: Secondary | ICD-10-CM | POA: Diagnosis not present

## 2021-10-03 DIAGNOSIS — M79672 Pain in left foot: Secondary | ICD-10-CM | POA: Diagnosis not present

## 2021-10-03 DIAGNOSIS — M79671 Pain in right foot: Secondary | ICD-10-CM | POA: Diagnosis not present

## 2021-10-08 ENCOUNTER — Encounter: Payer: Self-pay | Admitting: Psychiatry

## 2021-10-08 ENCOUNTER — Ambulatory Visit (INDEPENDENT_AMBULATORY_CARE_PROVIDER_SITE_OTHER): Payer: Medicare Other | Admitting: Psychiatry

## 2021-10-08 DIAGNOSIS — F5105 Insomnia due to other mental disorder: Secondary | ICD-10-CM | POA: Diagnosis not present

## 2021-10-08 DIAGNOSIS — F3342 Major depressive disorder, recurrent, in full remission: Secondary | ICD-10-CM

## 2021-10-08 DIAGNOSIS — F900 Attention-deficit hyperactivity disorder, predominantly inattentive type: Secondary | ICD-10-CM

## 2021-10-08 MED ORDER — HYDROXYZINE HCL 25 MG PO TABS: ORAL_TABLET | ORAL | 1 refills | Status: DC

## 2021-10-08 MED ORDER — METHYLPHENIDATE HCL ER (OSM) 36 MG PO TBCR: 72.0000 mg | EXTENDED_RELEASE_TABLET | Freq: Every day | ORAL | 0 refills | Status: DC

## 2021-10-08 MED ORDER — ARIPIPRAZOLE 5 MG PO TABS: 5.0000 mg | ORAL_TABLET | Freq: Every day | ORAL | 1 refills | Status: DC

## 2021-10-08 MED ORDER — VILAZODONE HCL 40 MG PO TABS: 40.0000 mg | ORAL_TABLET | Freq: Every day | ORAL | 1 refills | Status: DC

## 2021-10-08 NOTE — Progress Notes (Signed)
Kristine Martinez ?[id number] ?06-03-1956 ?66 y.o. ? ?Subjective:  ? ?Patient ID:  Kristine Martinez is a 66 y.o. (DOB 1956-02-02) female. ? ?Chief Complaint:  ?Chief Complaint  ?Patient presents with  ? Follow-up  ? Depression  ? Anxiety  ? Sleeping Problem  ? ADD  ? ? ?Kristine Martinez presents to the office today for follow-up of ADD and depression. ? ? seen August 19, 2018.  Because of concerns about attention and focus Concerta was increased to 36 mg every morning.  The other medications were continued without change. Doesn't get tasks done.  Can't make myself do it.  Denies feeling depressed despite stressors.  Enjoys gkids and reading. Trouble staying focused.  Wants to increase Concerta. ? ?seen March 16, 2019.  She wanted to increase the Concerta therefore was increased to 54 mg daily.  The other meds were left unchanged. Generally Good depression and anxiety response with Viibryd 40 + Abilify 5.  No SE ? ?seen June 22, 2019.  She was doing okay with regard to mood and anxiety but did not notice sufficient benefit with the increase in Concerta to 54 mg daily.  Therefore the dosage was increased to 72 mg daily. ?Increase in Concerta noticed a little change but not as much as she'd like to be with productivity.  It's a little better.  GKids staying with her with son temporarily.   ? Patient reports stable mood and denies depressed or irritable moods.  Patient denies any recent difficulty with anxiety.  Patient denies difficulty with sleep initiation or maintenance but recent anniversary nightmares.  Overall fewer.  7-8 hours.  Otherwise Restoril helps.  Has awoken screaming.  This is unusual.  Been watching murder mysteries. Denies appetite disturbance.  Patient reports that energy and motivation have been good.    Patient denies any suicidal ideation. ?Plan no med changes. ? ?04/19/20 appt with following noted: ?Medicare in Feb.  Picked plan and won't cover Concerta nor Viibryd.  Disc this at length.   ?Still doing well  with the meds.  Pleased and doesn't want to change.  Holidays are hard and doesn't want to change at holidays. ?Plan: BC partial response continue Concerta 72 mg daily ?Good depression and anxiety response with Viibryd 40 + Abilify 5.  ? ?08/14/2020 appointment with following noted: ?Disc insurance problems with hydroxyzine and disc reasons why they give a hassle about it.  Needs it to help sleep.  Generally sleep good with occ bad dreams. ?Still good mood and pleased with meds.  Able to stay on Viibryd and Concerta now that turned 66 yo. ?Foggy tired and can't function with out Concerta.  Helps focus and productivity. Questions about how to get refills.   ?3 alcoholic sons.  One got rehab and is sober.  One ruined Public house manager. ?Active Mormon helps. ?Quit Pepsi and Mtn Dew. ?Patient reports stable mood and denies depressed or irritable moods.  Patient denies any recent difficulty with anxiety.  Patient denies difficulty with sleep initiation or maintenance. Denies appetite disturbance.  Patient reports that energy and motivation have been good.  Patient denies any difficulty with concentration.  Patient denies any suicidal ideation. ? ?01/14/2021 appt noted: ?Need another insurance company.  Mail lost $500 dollars of meds and problems with [employer]. ?Doesn't want to change from [employer]. ?Meds working for her and doesn't want change.   ?Disc insurance problems with hydroxyzine and disc reasons why they give a hassle about it.  Needs it to help sleep.  ?  Patient reports stable mood and denies depressed or irritable moods.  Patient denies any recent difficulty with anxiety.  Patient denies difficulty with sleep initiation or maintenance. Denies appetite disturbance.  Patient reports that energy and motivation have been good.  Patient denies any difficulty with concentration.  Patient denies any suicidal ideation. ?Doing well with stimulants. ?No SE ?Repeat liver enzymes normal ?Plan: no med changes ? ?05/21/21 appt  noted: ?Continues med. ?Stressed bc living with 2 alcoholic sons.  Pray a lot and stay in room a lot. ?Started counseling helpful twice. ?Continues viibryd 40, Abilify 5 mg, temazepam 30 HS,Hydroxyzine 25 HS, concerta 36 mg AM ?No SE. ?Plan: No med changes ? ?10/08/2021 appointment with the following noted: ?Disc trouble getting Concerta.   Asks about alternatives.  ?Satisfied with meds otherwise. ?Patient reports stable mood and denies depressed or irritable moods.  Patient denies any recent difficulty with anxiety but stressed..  Patient denies difficulty with sleep initiation or maintenance. Denies appetite disturbance.  Patient reports that energy and motivation have been good.  Patient denies any difficulty with concentration.  Patient denies any suicidal ideation. ? ?Past Psychiatric Medication Trials: Vyvanse, Concerta 72,  ?Ambien side effects, trazodone, prazosin, hydroxyzine, Lunesta, temazepam 30 ?Viibryd, duloxetine 90, Wellbutrin 450, fluoxetine,  ? ?Review of Systems:  ?Review of Systems  ?Cardiovascular:  Negative for palpitations.  ?Musculoskeletal:  Positive for arthralgias and gait problem.  ?Neurological:  Negative for dizziness, tremors and weakness.  ?Psychiatric/Behavioral:  Positive for decreased concentration. Negative for agitation, behavioral problems, confusion, dysphoric mood, hallucinations, self-injury, sleep disturbance and suicidal ideas. The patient is not nervous/anxious and is not hyperactive.  Normal BP ? ?Medications: I have reviewed the patient's current medications. ? ?Current Outpatient Medications  ?Medication Sig Dispense Refill  ? Calcium Carbonate-Vitamin D (CALTRATE 600+D PO) Take by mouth.    ? calcium citrate-vitamin D (CITRACAL+D) 315-200 MG-UNIT tablet Take 1 tablet by mouth 2 (two) times daily.    ? levothyroxine (SYNTHROID) 100 MCG tablet Take 1 tablet (100 mcg total) by mouth daily before breakfast. 90 tablet 0  ? methylphenidate 36 MG PO CR tablet methylphenidate  ER 36 mg tablet,extended release 24 hr    ? omeprazole (PRILOSEC OTC) 20 MG tablet Take 20 mg by mouth daily.    ? pantoprazole (PROTONIX) 40 MG tablet Take 1 tablet (40 mg total) by mouth daily. 90 tablet 1  ? temazepam (RESTORIL) 30 MG capsule TAKE 1 CAPSULE(30 MG) BY MOUTH AT BEDTIME 90 capsule 1  ? thyroid (ARMOUR) 60 MG tablet     ? ARIPiprazole (ABILIFY) 5 MG tablet Take 1 tablet (5 mg total) by mouth daily. 90 tablet 1  ? hydrOXYzine (ATARAX) 25 MG tablet TAKE 1 TABLET(25 MG) BY MOUTH AT BEDTIME AS NEEDED 90 tablet 1  ? meloxicam (MOBIC) 15 MG tablet TAKE 1 TABLET(15 MG) BY MOUTH DAILY AS NEEDED FOR PAIN (Patient not taking: Reported on 10/08/2021) 30 tablet 1  ? methylphenidate (CONCERTA) 36 MG PO CR tablet Take 2 tablets (72 mg total) by mouth daily. 120 tablet 0  ? Vilazodone HCl (VIIBRYD) 40 MG TABS Take 1 tablet (40 mg total) by mouth daily. 90 tablet 1  ? ?No current facility-administered medications for this visit.  ? ? ?Medication Side Effects: None ? ?Allergies: No Known Allergies ? ?Past Medical History:  ?Diagnosis Date  ? Allergic rhinitis   ? Attention deficit disorder (ADD) without hyperactivity   ? Chronic kidney disease (CKD), active medical management without dialysis, stage 3 (moderate) (  [facility])   ? Depression   ? Gastro-esophageal reflux disease without esophagitis   ? High cholesterol   ? Hypothyroidism   ? Migraine with aura   ? NASH (nonalcoholic steatohepatitis)   ? OA (osteoarthritis)   ? Obesity   ? Osteopenia   ? Prediabetes   ? Sacroiliitis ([place])   ? Sinusitis   ? Vitamin B12 deficiency   ? Vitamin D deficiency   ? ? ?Family History  ?Problem Relation Age of Onset  ? Alzheimer's disease Maternal Aunt   ? ? ?Social History  ? ?Socioeconomic History  ? Marital status: Widowed  ?  Spouse name: Not on file  ? Number of children: Not on file  ? Years of education: Not on file  ? Highest education level: Not on file  ?Occupational History  ? Not on file  ?Tobacco Use  ? Smoking status: Never   ? Smokeless tobacco: Never  ?Substance and Sexual Activity  ? Alcohol use: Not on file  ? Drug use: Not on file  ? Sexual activity: Not on file  ?Other Topics Concern  ? Not on file  ?Social History Narrative  ?

## 2021-10-09 DIAGNOSIS — M1712 Unilateral primary osteoarthritis, left knee: Secondary | ICD-10-CM | POA: Diagnosis not present

## 2021-10-09 DIAGNOSIS — M1711 Unilateral primary osteoarthritis, right knee: Secondary | ICD-10-CM | POA: Diagnosis not present

## 2021-10-09 DIAGNOSIS — M170 Bilateral primary osteoarthritis of knee: Secondary | ICD-10-CM | POA: Diagnosis not present

## 2021-10-10 DIAGNOSIS — M79671 Pain in right foot: Secondary | ICD-10-CM | POA: Diagnosis not present

## 2021-10-10 DIAGNOSIS — M722 Plantar fascial fibromatosis: Secondary | ICD-10-CM | POA: Diagnosis not present

## 2021-10-10 DIAGNOSIS — R2689 Other abnormalities of gait and mobility: Secondary | ICD-10-CM | POA: Diagnosis not present

## 2021-10-10 DIAGNOSIS — M79672 Pain in left foot: Secondary | ICD-10-CM | POA: Diagnosis not present

## 2021-10-14 DIAGNOSIS — M79671 Pain in right foot: Secondary | ICD-10-CM | POA: Diagnosis not present

## 2021-10-14 DIAGNOSIS — R2689 Other abnormalities of gait and mobility: Secondary | ICD-10-CM | POA: Diagnosis not present

## 2021-10-14 DIAGNOSIS — M722 Plantar fascial fibromatosis: Secondary | ICD-10-CM | POA: Diagnosis not present

## 2021-10-14 DIAGNOSIS — M79672 Pain in left foot: Secondary | ICD-10-CM | POA: Diagnosis not present

## 2021-10-15 ENCOUNTER — Other Ambulatory Visit: Payer: Self-pay

## 2021-10-15 DIAGNOSIS — R0981 Nasal congestion: Secondary | ICD-10-CM | POA: Diagnosis not present

## 2021-10-15 DIAGNOSIS — J029 Acute pharyngitis, unspecified: Secondary | ICD-10-CM | POA: Diagnosis not present

## 2021-10-15 MED ORDER — LEVOTHYROXINE SODIUM 100 MCG PO TABS: 100.0000 ug | ORAL_TABLET | Freq: Every day | ORAL | 1 refills | Status: AC

## 2021-10-17 DIAGNOSIS — M79672 Pain in left foot: Secondary | ICD-10-CM | POA: Diagnosis not present

## 2021-10-17 DIAGNOSIS — M722 Plantar fascial fibromatosis: Secondary | ICD-10-CM | POA: Diagnosis not present

## 2021-10-17 DIAGNOSIS — R2689 Other abnormalities of gait and mobility: Secondary | ICD-10-CM | POA: Diagnosis not present

## 2021-10-17 DIAGNOSIS — M79671 Pain in right foot: Secondary | ICD-10-CM | POA: Diagnosis not present

## 2021-10-28 ENCOUNTER — Other Ambulatory Visit: Payer: Self-pay | Admitting: Psychiatry

## 2021-10-28 DIAGNOSIS — F5105 Insomnia due to other mental disorder: Secondary | ICD-10-CM

## 2021-10-29 DIAGNOSIS — R2689 Other abnormalities of gait and mobility: Secondary | ICD-10-CM | POA: Diagnosis not present

## 2021-10-29 DIAGNOSIS — M79671 Pain in right foot: Secondary | ICD-10-CM | POA: Diagnosis not present

## 2021-10-29 DIAGNOSIS — M79672 Pain in left foot: Secondary | ICD-10-CM | POA: Diagnosis not present

## 2021-10-29 DIAGNOSIS — M722 Plantar fascial fibromatosis: Secondary | ICD-10-CM | POA: Diagnosis not present

## 2021-11-05 DIAGNOSIS — M722 Plantar fascial fibromatosis: Secondary | ICD-10-CM | POA: Diagnosis not present

## 2021-11-05 DIAGNOSIS — M79672 Pain in left foot: Secondary | ICD-10-CM | POA: Diagnosis not present

## 2021-11-05 DIAGNOSIS — R2689 Other abnormalities of gait and mobility: Secondary | ICD-10-CM | POA: Diagnosis not present

## 2021-11-05 DIAGNOSIS — M79671 Pain in right foot: Secondary | ICD-10-CM | POA: Diagnosis not present

## 2021-11-07 DIAGNOSIS — M722 Plantar fascial fibromatosis: Secondary | ICD-10-CM | POA: Diagnosis not present

## 2021-11-07 DIAGNOSIS — M79672 Pain in left foot: Secondary | ICD-10-CM | POA: Diagnosis not present

## 2021-11-07 DIAGNOSIS — M79671 Pain in right foot: Secondary | ICD-10-CM | POA: Diagnosis not present

## 2021-11-07 DIAGNOSIS — R2689 Other abnormalities of gait and mobility: Secondary | ICD-10-CM | POA: Diagnosis not present

## 2021-11-18 ENCOUNTER — Telehealth: Payer: Self-pay | Admitting: Psychiatry

## 2021-11-18 NOTE — Telephone Encounter (Signed)
Pt Lvm on 6/2 @ 8:11p.  She wants to know if Dr Clovis Pu will send in script for Temazepam '30mg'$  capsules to [facility] in [facility].  She has about 10 pills left and she is going to [place].  She would like a 90 day script.  Next appt 10/2

## 2021-11-18 NOTE — Telephone Encounter (Signed)
Patient has a 90-day refill available at her pharmacy. Notified patient.

## 2021-11-19 ENCOUNTER — Ambulatory Visit: Payer: BLUE CROSS/BLUE SHIELD | Admitting: Psychiatry

## 2021-11-29 DIAGNOSIS — M79671 Pain in right foot: Secondary | ICD-10-CM | POA: Diagnosis not present

## 2021-11-29 DIAGNOSIS — R2689 Other abnormalities of gait and mobility: Secondary | ICD-10-CM | POA: Diagnosis not present

## 2021-11-29 DIAGNOSIS — M79672 Pain in left foot: Secondary | ICD-10-CM | POA: Diagnosis not present

## 2021-11-29 DIAGNOSIS — M722 Plantar fascial fibromatosis: Secondary | ICD-10-CM | POA: Diagnosis not present

## 2021-12-04 DIAGNOSIS — M79672 Pain in left foot: Secondary | ICD-10-CM | POA: Diagnosis not present

## 2021-12-04 DIAGNOSIS — R2689 Other abnormalities of gait and mobility: Secondary | ICD-10-CM | POA: Diagnosis not present

## 2021-12-04 DIAGNOSIS — M722 Plantar fascial fibromatosis: Secondary | ICD-10-CM | POA: Diagnosis not present

## 2021-12-04 DIAGNOSIS — M79671 Pain in right foot: Secondary | ICD-10-CM | POA: Diagnosis not present

## 2021-12-19 DIAGNOSIS — M722 Plantar fascial fibromatosis: Secondary | ICD-10-CM | POA: Diagnosis not present

## 2021-12-19 DIAGNOSIS — R2689 Other abnormalities of gait and mobility: Secondary | ICD-10-CM | POA: Diagnosis not present

## 2021-12-19 DIAGNOSIS — M79672 Pain in left foot: Secondary | ICD-10-CM | POA: Diagnosis not present

## 2021-12-19 DIAGNOSIS — M79671 Pain in right foot: Secondary | ICD-10-CM | POA: Diagnosis not present

## 2021-12-20 DIAGNOSIS — E039 Hypothyroidism, unspecified: Secondary | ICD-10-CM | POA: Diagnosis not present

## 2021-12-20 DIAGNOSIS — E538 Deficiency of other specified B group vitamins: Secondary | ICD-10-CM | POA: Diagnosis not present

## 2021-12-20 DIAGNOSIS — N183 Chronic kidney disease, stage 3 unspecified: Secondary | ICD-10-CM | POA: Diagnosis not present

## 2021-12-20 DIAGNOSIS — E559 Vitamin D deficiency, unspecified: Secondary | ICD-10-CM | POA: Diagnosis not present

## 2021-12-30 DIAGNOSIS — M79671 Pain in right foot: Secondary | ICD-10-CM | POA: Diagnosis not present

## 2021-12-30 DIAGNOSIS — Z0000 Encounter for general adult medical examination without abnormal findings: Secondary | ICD-10-CM | POA: Diagnosis not present

## 2021-12-30 DIAGNOSIS — R2689 Other abnormalities of gait and mobility: Secondary | ICD-10-CM | POA: Diagnosis not present

## 2021-12-30 DIAGNOSIS — E039 Hypothyroidism, unspecified: Secondary | ICD-10-CM | POA: Diagnosis not present

## 2021-12-30 DIAGNOSIS — M79672 Pain in left foot: Secondary | ICD-10-CM | POA: Diagnosis not present

## 2021-12-30 DIAGNOSIS — M5459 Other low back pain: Secondary | ICD-10-CM | POA: Diagnosis not present

## 2021-12-30 DIAGNOSIS — E538 Deficiency of other specified B group vitamins: Secondary | ICD-10-CM | POA: Diagnosis not present

## 2021-12-30 DIAGNOSIS — E559 Vitamin D deficiency, unspecified: Secondary | ICD-10-CM | POA: Diagnosis not present

## 2022-01-10 DIAGNOSIS — M79672 Pain in left foot: Secondary | ICD-10-CM | POA: Diagnosis not present

## 2022-01-10 DIAGNOSIS — M5459 Other low back pain: Secondary | ICD-10-CM | POA: Diagnosis not present

## 2022-01-10 DIAGNOSIS — M79671 Pain in right foot: Secondary | ICD-10-CM | POA: Diagnosis not present

## 2022-01-10 DIAGNOSIS — R2689 Other abnormalities of gait and mobility: Secondary | ICD-10-CM | POA: Diagnosis not present

## 2022-01-13 DIAGNOSIS — M79672 Pain in left foot: Secondary | ICD-10-CM | POA: Diagnosis not present

## 2022-01-13 DIAGNOSIS — M5459 Other low back pain: Secondary | ICD-10-CM | POA: Diagnosis not present

## 2022-01-13 DIAGNOSIS — R2689 Other abnormalities of gait and mobility: Secondary | ICD-10-CM | POA: Diagnosis not present

## 2022-01-13 DIAGNOSIS — M79671 Pain in right foot: Secondary | ICD-10-CM | POA: Diagnosis not present

## 2022-01-16 DIAGNOSIS — M79671 Pain in right foot: Secondary | ICD-10-CM | POA: Diagnosis not present

## 2022-01-16 DIAGNOSIS — R2689 Other abnormalities of gait and mobility: Secondary | ICD-10-CM | POA: Diagnosis not present

## 2022-01-16 DIAGNOSIS — M79672 Pain in left foot: Secondary | ICD-10-CM | POA: Diagnosis not present

## 2022-01-16 DIAGNOSIS — M5459 Other low back pain: Secondary | ICD-10-CM | POA: Diagnosis not present

## 2022-01-20 DIAGNOSIS — M79671 Pain in right foot: Secondary | ICD-10-CM | POA: Diagnosis not present

## 2022-01-20 DIAGNOSIS — M79672 Pain in left foot: Secondary | ICD-10-CM | POA: Diagnosis not present

## 2022-01-20 DIAGNOSIS — R2689 Other abnormalities of gait and mobility: Secondary | ICD-10-CM | POA: Diagnosis not present

## 2022-01-20 DIAGNOSIS — M5459 Other low back pain: Secondary | ICD-10-CM | POA: Diagnosis not present

## 2022-01-23 ENCOUNTER — Telehealth: Payer: Self-pay | Admitting: Psychiatry

## 2022-01-23 ENCOUNTER — Other Ambulatory Visit: Payer: Self-pay

## 2022-01-23 DIAGNOSIS — M79671 Pain in right foot: Secondary | ICD-10-CM | POA: Diagnosis not present

## 2022-01-23 DIAGNOSIS — M5459 Other low back pain: Secondary | ICD-10-CM | POA: Diagnosis not present

## 2022-01-23 DIAGNOSIS — F900 Attention-deficit hyperactivity disorder, predominantly inattentive type: Secondary | ICD-10-CM

## 2022-01-23 DIAGNOSIS — M79672 Pain in left foot: Secondary | ICD-10-CM | POA: Diagnosis not present

## 2022-01-23 DIAGNOSIS — R2689 Other abnormalities of gait and mobility: Secondary | ICD-10-CM | POA: Diagnosis not present

## 2022-01-23 NOTE — Telephone Encounter (Signed)
Filled 6/27 for 60 days,due 8/26

## 2022-01-23 NOTE — Telephone Encounter (Signed)
Pt LVM 8/9 @9 :18p.  She is asking for refill of Methylphenidate to [facility] on [address].  She is asking for a 90 day script.  Next appt 10/2

## 2022-01-30 DIAGNOSIS — M79672 Pain in left foot: Secondary | ICD-10-CM | POA: Diagnosis not present

## 2022-01-30 DIAGNOSIS — M5459 Other low back pain: Secondary | ICD-10-CM | POA: Diagnosis not present

## 2022-01-30 DIAGNOSIS — M79671 Pain in right foot: Secondary | ICD-10-CM | POA: Diagnosis not present

## 2022-01-30 DIAGNOSIS — R2689 Other abnormalities of gait and mobility: Secondary | ICD-10-CM | POA: Diagnosis not present

## 2022-02-03 DIAGNOSIS — M5459 Other low back pain: Secondary | ICD-10-CM | POA: Diagnosis not present

## 2022-02-03 DIAGNOSIS — R2689 Other abnormalities of gait and mobility: Secondary | ICD-10-CM | POA: Diagnosis not present

## 2022-02-03 DIAGNOSIS — M79671 Pain in right foot: Secondary | ICD-10-CM | POA: Diagnosis not present

## 2022-02-03 DIAGNOSIS — M79672 Pain in left foot: Secondary | ICD-10-CM | POA: Diagnosis not present

## 2022-02-07 ENCOUNTER — Other Ambulatory Visit: Payer: Self-pay

## 2022-02-07 MED ORDER — METHYLPHENIDATE HCL ER (OSM) 36 MG PO TBCR: 72.0000 mg | EXTENDED_RELEASE_TABLET | Freq: Every day | ORAL | 0 refills | Status: DC

## 2022-02-13 DIAGNOSIS — M5459 Other low back pain: Secondary | ICD-10-CM | POA: Diagnosis not present

## 2022-02-13 DIAGNOSIS — M79671 Pain in right foot: Secondary | ICD-10-CM | POA: Diagnosis not present

## 2022-02-13 DIAGNOSIS — R2689 Other abnormalities of gait and mobility: Secondary | ICD-10-CM | POA: Diagnosis not present

## 2022-02-13 DIAGNOSIS — M79672 Pain in left foot: Secondary | ICD-10-CM | POA: Diagnosis not present

## 2022-02-20 ENCOUNTER — Other Ambulatory Visit: Payer: Self-pay

## 2022-02-20 ENCOUNTER — Telehealth: Payer: Self-pay | Admitting: Psychiatry

## 2022-02-20 DIAGNOSIS — F5105 Insomnia due to other mental disorder: Secondary | ICD-10-CM

## 2022-02-20 MED ORDER — TEMAZEPAM 30 MG PO CAPS: ORAL_CAPSULE | ORAL | 0 refills | Status: DC

## 2022-02-20 NOTE — Telephone Encounter (Signed)
Next visit is 03/17/22. Requesting refill on Temazepam 30 mg called to:  [facility] [place], [place] AT [address]  Phone:  [phone]  Fax:  [phone]

## 2022-02-20 NOTE — Telephone Encounter (Signed)
Pended.

## 2022-02-27 DIAGNOSIS — M5459 Other low back pain: Secondary | ICD-10-CM | POA: Diagnosis not present

## 2022-02-27 DIAGNOSIS — M79672 Pain in left foot: Secondary | ICD-10-CM | POA: Diagnosis not present

## 2022-02-27 DIAGNOSIS — M79671 Pain in right foot: Secondary | ICD-10-CM | POA: Diagnosis not present

## 2022-02-27 DIAGNOSIS — R2689 Other abnormalities of gait and mobility: Secondary | ICD-10-CM | POA: Diagnosis not present

## 2022-03-05 DIAGNOSIS — M79671 Pain in right foot: Secondary | ICD-10-CM | POA: Diagnosis not present

## 2022-03-05 DIAGNOSIS — M79672 Pain in left foot: Secondary | ICD-10-CM | POA: Diagnosis not present

## 2022-03-05 DIAGNOSIS — M5459 Other low back pain: Secondary | ICD-10-CM | POA: Diagnosis not present

## 2022-03-05 DIAGNOSIS — R2689 Other abnormalities of gait and mobility: Secondary | ICD-10-CM | POA: Diagnosis not present

## 2022-03-07 DIAGNOSIS — M79671 Pain in right foot: Secondary | ICD-10-CM | POA: Diagnosis not present

## 2022-03-07 DIAGNOSIS — R2689 Other abnormalities of gait and mobility: Secondary | ICD-10-CM | POA: Diagnosis not present

## 2022-03-07 DIAGNOSIS — M79672 Pain in left foot: Secondary | ICD-10-CM | POA: Diagnosis not present

## 2022-03-07 DIAGNOSIS — M5459 Other low back pain: Secondary | ICD-10-CM | POA: Diagnosis not present

## 2022-03-10 DIAGNOSIS — M79671 Pain in right foot: Secondary | ICD-10-CM | POA: Diagnosis not present

## 2022-03-10 DIAGNOSIS — M79672 Pain in left foot: Secondary | ICD-10-CM | POA: Diagnosis not present

## 2022-03-10 DIAGNOSIS — R2689 Other abnormalities of gait and mobility: Secondary | ICD-10-CM | POA: Diagnosis not present

## 2022-03-10 DIAGNOSIS — M5459 Other low back pain: Secondary | ICD-10-CM | POA: Diagnosis not present

## 2022-03-13 DIAGNOSIS — M79671 Pain in right foot: Secondary | ICD-10-CM | POA: Diagnosis not present

## 2022-03-13 DIAGNOSIS — M79672 Pain in left foot: Secondary | ICD-10-CM | POA: Diagnosis not present

## 2022-03-13 DIAGNOSIS — R2689 Other abnormalities of gait and mobility: Secondary | ICD-10-CM | POA: Diagnosis not present

## 2022-03-13 DIAGNOSIS — M5459 Other low back pain: Secondary | ICD-10-CM | POA: Diagnosis not present

## 2022-03-17 ENCOUNTER — Encounter: Payer: Self-pay | Admitting: Psychiatry

## 2022-03-17 ENCOUNTER — Ambulatory Visit: Payer: Medicare Other | Admitting: Psychiatry

## 2022-03-17 VITALS — BP 114/75 | HR 95

## 2022-03-17 DIAGNOSIS — Z6379 Other stressful life events affecting family and household: Secondary | ICD-10-CM | POA: Diagnosis not present

## 2022-03-17 DIAGNOSIS — F5105 Insomnia due to other mental disorder: Secondary | ICD-10-CM | POA: Diagnosis not present

## 2022-03-17 DIAGNOSIS — F900 Attention-deficit hyperactivity disorder, predominantly inattentive type: Secondary | ICD-10-CM | POA: Diagnosis not present

## 2022-03-17 DIAGNOSIS — F331 Major depressive disorder, recurrent, moderate: Secondary | ICD-10-CM | POA: Diagnosis not present

## 2022-03-17 MED ORDER — METHYLPHENIDATE HCL ER (OSM) 36 MG PO TBCR: 72.0000 mg | EXTENDED_RELEASE_TABLET | Freq: Every day | ORAL | 0 refills | Status: DC

## 2022-03-17 NOTE — Progress Notes (Signed)
[name] [id number] 12-17-1955 66 y.o.  Subjective:   Patient ID:  Kristine Martinez is a 67 y.o. (DOB Oct 27, 1955) female.  Chief Complaint:  Chief Complaint  Patient presents with   Follow-up   Depression   ADHD   Stress    Kristine Martinez presents to the office today for follow-up of ADD and depression.   seen August 19, 2018.  Because of concerns about attention and focus Concerta was increased to 36 mg every morning.  The other medications were continued without change. Doesn't get tasks done.  Can't make myself do it.  Denies feeling depressed despite stressors.  Enjoys gkids and reading. Trouble staying focused.  Wants to increase Concerta.  seen March 16, 2019.  She wanted to increase the Concerta therefore was increased to 54 mg daily.  The other meds were left unchanged. Generally Good depression and anxiety response with Viibryd 40 + Abilify 5.  No SE  seen June 22, 2019.  She was doing okay with regard to mood and anxiety but did not notice sufficient benefit with the increase in Concerta to 54 mg daily.  Therefore the dosage was increased to 72 mg daily. Increase in Concerta noticed a little change but not as much as she'd like to be with productivity.  It's a little better.  GKids staying with her with son temporarily.    Patient reports stable mood and denies depressed or irritable moods.  Patient denies any recent difficulty with anxiety.  Patient denies difficulty with sleep initiation or maintenance but recent anniversary nightmares.  Overall fewer.  7-8 hours.  Otherwise Restoril helps.  Has awoken screaming.  This is unusual.  Been watching murder mysteries. Denies appetite disturbance.  Patient reports that energy and motivation have been good.    Patient denies any suicidal ideation. Plan no med changes.  04/19/20 appt with following noted: Medicare in Feb.  Picked plan and won't cover Concerta nor Viibryd.  Disc this at length.   Still doing well with the meds.   Pleased and doesn't want to change.  Holidays are hard and doesn't want to change at holidays. Plan: BC partial response continue Concerta 72 mg daily Good depression and anxiety response with Viibryd 40 + Abilify 5.   08/14/2020 appointment with following noted: Disc insurance problems with hydroxyzine and disc reasons why they give a hassle about it.  Needs it to help sleep.  Generally sleep good with occ bad dreams. Still good mood and pleased with meds.  Able to stay on Viibryd and Concerta now that turned 66 yo. Foggy tired and can't function with out Concerta.  Helps focus and productivity. Questions about how to get refills.   3 alcoholic sons.  One got rehab and is sober.  One ruined Public house manager. Active [employer] helps. Quit Pepsi and Mtn Dew. Patient reports stable mood and denies depressed or irritable moods.  Patient denies any recent difficulty with anxiety.  Patient denies difficulty with sleep initiation or maintenance. Denies appetite disturbance.  Patient reports that energy and motivation have been good.  Patient denies any difficulty with concentration.  Patient denies any suicidal ideation.  01/14/2021 appt noted: Need another insurance company.  Mail lost $500 dollars of meds and problems with [employer]. Doesn't want to change from [place]. Meds working for her and doesn't want change.   Disc insurance problems with hydroxyzine and disc reasons why they give a hassle about it.  Needs it to help sleep.  Patient reports stable mood  and denies depressed or irritable moods.  Patient denies any recent difficulty with anxiety.  Patient denies difficulty with sleep initiation or maintenance. Denies appetite disturbance.  Patient reports that energy and motivation have been good.  Patient denies any difficulty with concentration.  Patient denies any suicidal ideation. Doing well with stimulants. No SE Repeat liver enzymes normal Plan: no med changes  05/21/21 appt noted: Continues  med. Stressed bc living with 2 alcoholic sons.  Pray a lot and stay in room a lot. Started counseling helpful twice. Continues viibryd 40, Abilify 5 mg, temazepam 30 HS,Hydroxyzine 25 HS, concerta 36 mg AM No SE. Plan: No med changes  10/08/2021 appointment with the following noted: Disc trouble getting Concerta.   Asks about alternatives.  Satisfied with meds otherwise. Patient reports stable mood and denies depressed or irritable moods.  Patient denies any recent difficulty with anxiety but stressed..  Patient denies difficulty with sleep initiation or maintenance. Denies appetite disturbance.  Patient reports that energy and motivation have been good.  Patient denies any difficulty with concentration.  Patient denies any suicidal ideation. Plan: No med changes Continues viibryd 40, Abilify 5 mg, temazepam 30 HS,Hydroxyzine 25 HS, concerta 72 mg AM  95/0/93 appt noted: All kinds of family issues causing anxiety.  Had to get a restraining order on kid whose alcoholic. Don't want to change meds.  Prayer and faith helps.  Unmotivated for 3 weeks. Sleep with door locked for fear of drunk son. Sleep good with meds.  Takes temazepam and hydroxyzine. No SE.   PCP dx DM and increased thyroid med.   Past Psychiatric Medication Trials: Vyvanse, Concerta 72,  Ambien side effects, trazodone, prazosin, hydroxyzine, Lunesta, temazepam 30 Viibryd, duloxetine 90, Wellbutrin 450, fluoxetine,   Review of Systems:  Review of Systems  Cardiovascular:  Negative for palpitations.  Musculoskeletal:  Positive for arthralgias and gait problem.  Neurological:  Negative for dizziness, tremors and weakness.  Psychiatric/Behavioral:  Positive for decreased concentration and dysphoric mood. Negative for agitation, behavioral problems, confusion, hallucinations, self-injury, sleep disturbance and suicidal ideas. The patient is nervous/anxious. The patient is not hyperactive.   Normal BP  Medications: I have  reviewed the patient's current medications.  Current Outpatient Medications  Medication Sig Dispense Refill   ARIPiprazole (ABILIFY) 5 MG tablet Take 1 tablet (5 mg total) by mouth daily. 90 tablet 1   Calcium Carbonate-Vitamin D (CALTRATE 600+D PO) Take by mouth.     calcium citrate-vitamin D (CITRACAL+D) 315-200 MG-UNIT tablet Take 1 tablet by mouth 2 (two) times daily.     empagliflozin (JARDIANCE) 10 MG TABS tablet Take by mouth daily.     hydrOXYzine (ATARAX) 25 MG tablet TAKE 1 TABLET(25 MG) BY MOUTH AT BEDTIME AS NEEDED 30 tablet 2   levothyroxine (SYNTHROID) 100 MCG tablet Take 1 tablet (100 mcg total) by mouth daily before breakfast. 60 tablet 1   methylphenidate 36 MG PO CR tablet methylphenidate ER 36 mg tablet,extended release 24 hr     omeprazole (PRILOSEC OTC) 20 MG tablet Take 20 mg by mouth daily.     pantoprazole (PROTONIX) 40 MG tablet Take 1 tablet (40 mg total) by mouth daily. 90 tablet 1   temazepam (RESTORIL) 30 MG capsule TAKE 1 CAPSULE(30 MG) BY MOUTH AT BEDTIME 90 capsule 0   thyroid (ARMOUR) 60 MG tablet      Vilazodone HCl (VIIBRYD) 40 MG TABS Take 1 tablet (40 mg total) by mouth daily. 90 tablet 1   methylphenidate (CONCERTA) 36 MG  PO CR tablet Take 2 tablets (72 mg total) by mouth daily. 180 tablet 0   No current facility-administered medications for this visit.    Medication Side Effects: None  Allergies: No Known Allergies  Past Medical History:  Diagnosis Date   Allergic rhinitis    Attention deficit disorder (ADD) without hyperactivity    Chronic kidney disease (CKD), active medical management without dialysis, stage 3 (moderate) ([facility])    Depression    Gastro-esophageal reflux disease without esophagitis    High cholesterol    Hypothyroidism    Migraine with aura    NASH (nonalcoholic steatohepatitis)    OA (osteoarthritis)    Obesity    Osteopenia    Prediabetes    Sacroiliitis ([facility])    Sinusitis    Vitamin B12 deficiency    Vitamin D  deficiency     Family History  Problem Relation Age of Onset   Alzheimer's disease Maternal Aunt     Social History   Socioeconomic History   Marital status: Widowed    Spouse name: Not on file   Number of children: Not on file   Years of education: Not on file   Highest education level: Not on file  Occupational History   Not on file  Tobacco Use   Smoking status: Never   Smokeless tobacco: Never  Substance and Sexual Activity   Alcohol use: Not on file   Drug use: Not on file   Sexual activity: Not on file  Other Topics Concern   Not on file  Social History Narrative   Not on file   Social Determinants of Health   Financial Resource Strain: Not on file  Food Insecurity: Not on file  Transportation Needs: Not on file  Physical Activity: Not on file  Stress: Not on file  Social Connections: Not on file  Intimate Partner Violence: Not on file    Past Medical History, Surgical history, Social history, and Family history were reviewed and updated as appropriate.   Please see review of systems for further details on the patient's review from today.   Objective:   Physical Exam:  BP 114/75   Pulse 95   Physical Exam Constitutional:      General: She is not in acute distress.    Appearance: She is well-developed. She is obese.  Musculoskeletal:        General: No deformity.  Neurological:     Mental Status: She is alert and oriented to person, place, and time.     Motor: No tremor.     Coordination: Coordination normal.     Gait: Gait normal.  Psychiatric:        Attention and Perception: She is attentive.        Mood and Affect: Mood is anxious and depressed. Affect is not labile, blunt, angry, tearful or inappropriate.        Speech: Speech normal.        Behavior: Behavior normal. Behavior is not slowed.        Thought Content: Thought content normal. Thought content is not delusional. Thought content does not include homicidal or suicidal ideation.         Cognition and Memory: Cognition normal.        Judgment: Judgment normal.     Comments: Insight is good. No AIM     Lab Review:  No results found for: "NA", "K", "CL", "CO2", "GLUCOSE", "BUN", "CREATININE", "CALCIUM", "PROT", "ALBUMIN", "AST", "ALT", "ALKPHOS", "BILITOT", "GFRNONAA", "GFRAA"  Component Value Date/Time   WBC 6.2 01/16/2014 0843   RBC 4.74 01/16/2014 0843   HGB 13.2 01/16/2014 0843   HCT 38.9 01/16/2014 0843   PLT 212 01/16/2014 0843   MCV 82.1 01/16/2014 0843   MCH 27.8 01/16/2014 0843   MCHC 33.9 01/16/2014 0843   RDW 15.5 01/16/2014 0843    No results found for: "POCLITH", "LITHIUM"   No results found for: "PHENYTOIN", "PHENOBARB", "VALPROATE", "CBMZ"   .res Assessment: Plan:    Major depressive disorder, recurrent episode, moderate (HCC)  Stressful life events affecting family and household  Attention deficit hyperactivity disorder (ADHD), predominantly inattentive type - Plan: methylphenidate (CONCERTA) 36 MG PO CR tablet  Insomnia due to mental condition   Greater than 50% of 30 min face to face time with patient was spent on counseling and coordination of care. We discussed Educated difference between anhedonic depression with poor attention and ADHD and overlap of sx in detail.  She doesn't believe she's depressed.  Disc drug pricing for generics and GoodRx.  Extensived Disc differnence between insurance plans and coverage of meds. Meds are unique and there aren't cheap alternatives. Viibryd is expensive but in process of becoming generic.  Checked pricing with her.  BC partial response continue Concerta 72 mg daily. Tolerating last increase. BP and pulse were OK.   Disc the shortage and she asks about alternatives but not a good generic long acting option. Can't mange TID dosing of stimulants. Discussed potential benefits, risks, and side effects of stimulants with patient to include increased heart rate, palpitations, insomnia, increased  anxiety, increased irritability, or decreased appetite.  Instructed patient to contact office if experiencing any significant tolerability issues.  Good depression and anxiety response with Viibryd 40 + Abilify 5.  No SE  Tendency towards anxiety much better.  Discussed potential metabolic side effects associated with atypical antipsychotics, as well as potential risk for movement side effects. Advised pt to contact office if movement side effects occur.   Good response with sleep meds still. Sleep good overall with occ NM.  Not enough to restart NM meds. Hydroxyzine helps but still needs temazepam. Disc normal sleep stages.     Patient has a history of elevated liver enzymes which have returned to normal lately.  She has not had significant weight loss.  Answered questions about meds and elevated liver enzymes as well as her diagnosis of chronic kidney disease stage II or III and psychiatric medications.  She is not taking any psychiatric medications that should affect her kidneys.  Disc counseling.    No med changes Continues viibryd 40, Abilify 5 mg, temazepam 30 HS,Hydroxyzine 25 HS, concerta 72 mg AM  Fu 6 mos  Lynder Parents, MD, DFAPA    Please see After Visit Summary for patient specific instructions.  No future appointments.   No orders of the defined types were placed in this encounter.     -------------------------------

## 2022-03-25 DIAGNOSIS — M5459 Other low back pain: Secondary | ICD-10-CM | POA: Diagnosis not present

## 2022-03-25 DIAGNOSIS — M79672 Pain in left foot: Secondary | ICD-10-CM | POA: Diagnosis not present

## 2022-03-25 DIAGNOSIS — M79671 Pain in right foot: Secondary | ICD-10-CM | POA: Diagnosis not present

## 2022-03-25 DIAGNOSIS — R2689 Other abnormalities of gait and mobility: Secondary | ICD-10-CM | POA: Diagnosis not present

## 2022-03-26 DIAGNOSIS — R2689 Other abnormalities of gait and mobility: Secondary | ICD-10-CM | POA: Diagnosis not present

## 2022-03-26 DIAGNOSIS — M79672 Pain in left foot: Secondary | ICD-10-CM | POA: Diagnosis not present

## 2022-03-26 DIAGNOSIS — M79671 Pain in right foot: Secondary | ICD-10-CM | POA: Diagnosis not present

## 2022-03-26 DIAGNOSIS — M5459 Other low back pain: Secondary | ICD-10-CM | POA: Diagnosis not present

## 2022-04-03 DIAGNOSIS — R2689 Other abnormalities of gait and mobility: Secondary | ICD-10-CM | POA: Diagnosis not present

## 2022-04-03 DIAGNOSIS — M5459 Other low back pain: Secondary | ICD-10-CM | POA: Diagnosis not present

## 2022-04-03 DIAGNOSIS — M79672 Pain in left foot: Secondary | ICD-10-CM | POA: Diagnosis not present

## 2022-04-03 DIAGNOSIS — M79671 Pain in right foot: Secondary | ICD-10-CM | POA: Diagnosis not present

## 2022-04-07 ENCOUNTER — Telehealth: Payer: Self-pay | Admitting: Psychiatry

## 2022-04-07 ENCOUNTER — Other Ambulatory Visit: Payer: Self-pay

## 2022-04-07 DIAGNOSIS — F900 Attention-deficit hyperactivity disorder, predominantly inattentive type: Secondary | ICD-10-CM

## 2022-04-07 DIAGNOSIS — R2689 Other abnormalities of gait and mobility: Secondary | ICD-10-CM | POA: Diagnosis not present

## 2022-04-07 DIAGNOSIS — M79672 Pain in left foot: Secondary | ICD-10-CM | POA: Diagnosis not present

## 2022-04-07 DIAGNOSIS — M79671 Pain in right foot: Secondary | ICD-10-CM | POA: Diagnosis not present

## 2022-04-07 DIAGNOSIS — M5459 Other low back pain: Secondary | ICD-10-CM | POA: Diagnosis not present

## 2022-04-07 MED ORDER — METHYLPHENIDATE HCL ER (OSM) 36 MG PO TBCR: 72.0000 mg | EXTENDED_RELEASE_TABLET | Freq: Every day | ORAL | 0 refills | Status: DC

## 2022-04-07 NOTE — Telephone Encounter (Signed)
Next visit is 07/21/22. [place] states that [facility] is out of Methylphenidate. Kristopher Oppenheim at [facility] has it in stock.    [facility] [id number] [place], [place]  Phone:  [phone]  Fax:  [phone]

## 2022-04-07 NOTE — Telephone Encounter (Signed)
Pended.

## 2022-04-11 DIAGNOSIS — M79672 Pain in left foot: Secondary | ICD-10-CM | POA: Diagnosis not present

## 2022-04-11 DIAGNOSIS — R2689 Other abnormalities of gait and mobility: Secondary | ICD-10-CM | POA: Diagnosis not present

## 2022-04-11 DIAGNOSIS — M5459 Other low back pain: Secondary | ICD-10-CM | POA: Diagnosis not present

## 2022-04-11 DIAGNOSIS — M79671 Pain in right foot: Secondary | ICD-10-CM | POA: Diagnosis not present

## 2022-04-14 ENCOUNTER — Ambulatory Visit: Payer: Medicare Other | Admitting: Psychiatry

## 2022-04-14 DIAGNOSIS — F411 Generalized anxiety disorder: Secondary | ICD-10-CM | POA: Diagnosis not present
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
## 2022-04-23 DIAGNOSIS — R7303 Prediabetes: Secondary | ICD-10-CM | POA: Diagnosis not present

## 2022-04-28 ENCOUNTER — Ambulatory Visit: Payer: Medicare Other | Admitting: Psychiatry

## 2022-04-29 ENCOUNTER — Encounter: Payer: Self-pay | Admitting: Psychiatry

## 2022-04-29 ENCOUNTER — Ambulatory Visit (INDEPENDENT_AMBULATORY_CARE_PROVIDER_SITE_OTHER): Payer: Medicare Other | Admitting: Psychiatry

## 2022-04-29 DIAGNOSIS — F331 Major depressive disorder, recurrent, moderate: Secondary | ICD-10-CM | POA: Diagnosis not present

## 2022-04-29 NOTE — Progress Notes (Signed)
[facility] Counselor/Therapist Progress Note  Patient ID: LEZA APSEY, MRN: [id number],    Date: 04/29/2022  Time Spent: 55 minutes   Treatment Type: Individual Therapy  Reported Symptoms: depression, anxiety, denies any SI  Mental Status Exam:  Appearance:   Casual     Behavior:  Appropriate, Sharing, and Motivated  Motor:  Normal  Speech/Language:   Clear and Coherent  Affect:  Depressed  Mood:  anxious and depressed  Thought process:  goal directed  Thought content:    Some ruminating  Sensory/Perceptual disturbances:    WNL  Orientation:  oriented to person, place, time/date, situation, day of week, month of year, year, and stated date of Nov. 14, 2023  Attention:  Fair  Concentration:  Good and Fair  Memory:  I worry about it because it's been in my family, but no symptoms currently  Fund of knowledge:   Good  Insight:    Good  Judgment:   Good  Impulse Control:  Good   Risk Assessment: Danger to Self:  No Self-injurious Behavior: No Danger to Others: No Duty to Warn:no Physical Aggression / Violence:No  Access to Firearms a concern: No  Gang Involvement:No   Subjective: Patient in today reporting improvement in depression as she remain on her medication. Patient  very concerned about a son who is severe alcoholic ans she had to take a restraining order. He is living "in a sober house in [place]." Is attending AA. Good "so far" and son recently told her "one day at a time" referring to his journey in getting off alcohol and admission he is an alcoholic. Son is maintaining a job and following the program for now to stay  sober. Patient acknowledges her "tendency to be an enabler" and has been with alcoholic son. Working hard to try and not enable son in his work to stay sober. Discussed issues with whole family and her "worries". (Not all issues included in this note due to patient privacy needs.) Looking also at her strengths as well as her "need areas". Feels  her church is very supportive and she is actively involved.   Interventions: Cognitive Behavioral Therapy and Ego-Supportive  Long term goal: Reduce overall level, frequency, and intensity of the anxiety so that daily functioning is not impaired. Short term goal: Identify the major life conflicts from the past and present that form the basis for present anxiety.  Strategies: Develop behavioral and cognitive strategies to reduce or eliminate the irrational anxiety.   Diagnosis:   ICD-10-CM   1. Major depressive disorder, recurrent episode, moderate ([place])  F33.1      Plan: Patient in today working on her anxiety especially as it relates to relationship with son where she had to get a restraining order to keep him out of her home. Does feel she is doing better in setting boundaries with friends and son as needed. Shared more history re: death of her husband 29 yrs ago after a bike accident. Has had tremendous support by friends then and over the years.  He is struggling more now with some personal issues and also regarding regarding an alcoholic son with whom she had to get a restraining order at home and he is currently doing some better in using support resources locally and staying sober.  Patient still worries about him as she feels it is still early in his process of getting and staying sober and hopeful that he will make helpful decisions.  She is working  hard not to be "the enabler I can be".  Good work today in session as she talked about her relationship with this particular son and also some information over the history of the family that has influenced her in various ways.  (Not all details included in this note due to patient privacy needs.)  Encourage patient in positive behaviors including: Refraining from "enabling behavior" with her son who is working hard to remain sober, refrain from assuming negatives and worst-case scenarios, getting outside some each day, remaining in the present  focusing on what she can control her change, stay on her prescribed medication, healthy nutrition and exercise, remain involved with friends and church friends from whom she received support and has good relationships, positive self talk, refrain from self negating, allow her faith to be an emotional support as well as spiritual, letting go of things from the past that can hold her back currently, and recognize the strength she shows working with goal-directed behaviors to move in a direction that supports increased confidence and improved emotional health.  Goal review and progress/challenges noted with patient.  Next appointment within 2 weeks.  This record has been created using [employer].  Chart creation errors have been sought, but may not always have been located and corrected.  Such creation errors do not reflect on the standard of medical care provided.   Shanon Ace, LCSW

## 2022-04-30 DIAGNOSIS — M170 Bilateral primary osteoarthritis of knee: Secondary | ICD-10-CM | POA: Diagnosis not present

## 2022-05-01 DIAGNOSIS — R2689 Other abnormalities of gait and mobility: Secondary | ICD-10-CM | POA: Diagnosis not present

## 2022-05-01 DIAGNOSIS — M79671 Pain in right foot: Secondary | ICD-10-CM | POA: Diagnosis not present

## 2022-05-01 DIAGNOSIS — M5459 Other low back pain: Secondary | ICD-10-CM | POA: Diagnosis not present

## 2022-05-01 DIAGNOSIS — M79672 Pain in left foot: Secondary | ICD-10-CM | POA: Diagnosis not present

## 2022-05-02 DIAGNOSIS — R2689 Other abnormalities of gait and mobility: Secondary | ICD-10-CM | POA: Diagnosis not present

## 2022-05-02 DIAGNOSIS — M79672 Pain in left foot: Secondary | ICD-10-CM | POA: Diagnosis not present

## 2022-05-02 DIAGNOSIS — M5459 Other low back pain: Secondary | ICD-10-CM | POA: Diagnosis not present

## 2022-05-02 DIAGNOSIS — M79671 Pain in right foot: Secondary | ICD-10-CM | POA: Diagnosis not present

## 2022-05-07 DIAGNOSIS — M170 Bilateral primary osteoarthritis of knee: Secondary | ICD-10-CM | POA: Diagnosis not present

## 2022-05-13 ENCOUNTER — Telehealth: Payer: Self-pay | Admitting: Psychiatry

## 2022-05-13 ENCOUNTER — Other Ambulatory Visit: Payer: Self-pay

## 2022-05-13 ENCOUNTER — Ambulatory Visit (INDEPENDENT_AMBULATORY_CARE_PROVIDER_SITE_OTHER): Payer: Medicare Other | Admitting: Psychiatry

## 2022-05-13 DIAGNOSIS — F3342 Major depressive disorder, recurrent, in full remission: Secondary | ICD-10-CM

## 2022-05-13 DIAGNOSIS — R2689 Other abnormalities of gait and mobility: Secondary | ICD-10-CM | POA: Diagnosis not present

## 2022-05-13 DIAGNOSIS — M79671 Pain in right foot: Secondary | ICD-10-CM | POA: Diagnosis not present

## 2022-05-13 DIAGNOSIS — M5459 Other low back pain: Secondary | ICD-10-CM | POA: Diagnosis not present

## 2022-05-13 DIAGNOSIS — M79672 Pain in left foot: Secondary | ICD-10-CM | POA: Diagnosis not present

## 2022-05-13 DIAGNOSIS — F331 Major depressive disorder, recurrent, moderate: Secondary | ICD-10-CM

## 2022-05-13 MED ORDER — VILAZODONE HCL 40 MG PO TABS: 40.0000 mg | ORAL_TABLET | Freq: Every day | ORAL | 0 refills | Status: DC

## 2022-05-13 NOTE — Progress Notes (Signed)
[facility] Counselor/Therapist Progress Note  Patient ID: Kristine Martinez, MRN: [id number],    Date: 05/13/2022  Time Spent: 55 minutes   Treatment Type: Individual Therapy  Reported Symptoms: anxiety, depression  Mental Status Exam:  Appearance:   Casual     Behavior:  Appropriate, Sharing, and Motivated  Motor:  Normal  Speech/Language:   Clear and Coherent  Affect:  Anxious  Mood:  anxious  Thought process:  goal directed  Thought content:    WNL  Sensory/Perceptual disturbances:    WNL  Orientation:  oriented to person, place, time/date, situation, day of week, month of year, year, and stated date of Nov. 28, 2023  Attention:  Good  Concentration:  Good  Memory:  WNL  Fund of knowledge:   Good  Insight:    Good  Judgment:   Good  Impulse Control:  Good   Risk Assessment: Danger to Self:  No Self-injurious Behavior: No Danger to Others: No Duty to Warn:no Physical Aggression / Violence:No  Access to Firearms a concern: No  Gang Involvement:No   Subjective:  Patient today reporting mood improvement and depression and working harder on her anxiety. Needing session today to share and talk about some of her unresolved grief LK:GMWN of husband and "just not feeling the holidays."  Although he reports that family and extended family did gather together and had a good time with the kids this Thanksgiving.  Feeling tired and drained after holidays, and processed this in more detail.  Seem to feel somewhat of a lift after sharing holiday experiences but also information regarding son Kristine Martinez.  (Not all details included in this note due to patient privacy needs).  Concerns for son and she is trying not to be an enabler as she describes some tendencies in the past.  Is in contact with friends that are good support for her as well as her church that is supportive.  Tries to focus more on her strengths and yet pay attention also to her need areas and growth.  Interventions:  Cognitive Behavioral Therapy and Ego-Supportive  Long term goal: Reduce overall level, frequency, and intensity of the anxiety so that daily functioning is not impaired. Short term goal: Identify the major life conflicts from the past and present that form the basis for present anxiety.  Strategies: Develop behavioral and cognitive strategies to reduce or eliminate the irrational anxiety.   Diagnosis:   ICD-10-CM   1. Major depressive disorder, recurrent episode, moderate ([place])  F33.1      Plan: Patient in today working further on her anxiety and some depression.  Especially concerned about one of her sons as noted above and talk through these issues very openly in session today.  Does not want to enable and yet wants to be supportive and discusses how she sees herself doing this.  As noted above, (not all details included in this note due to patient privacy needs and sensitivity.)  Trying to continue setting boundaries with others as needed including family.  Husband's death was 29 years ago and she commented on how it does not seem like his been 29 years and further processed some of her feelings about that.  We will pick up with more goal-directed behavior work next session but today patient really needed to focus on the above. Encouraged patient in her practice of more positive behaviors as noted in session including: Refrain from assuming negatives and worst-case scenarios, avoiding enabling behavior with her son who is  working hard to remain sober, getting outside some each day, remaining in the present focusing on what she can control her change, stay on her prescribed medication, healthy nutrition and exercise, remain involved with friends and church friends from whom she received support and has good relationships, positive self talk, decreased self negating, allow her faith to be an emotional support as well as spiritual, letting go of things from the past that can hold her back currently,  and realize the strength she shows working with goal-directed behaviors to move in a direction that supports her improved emotional health and overall outlook.  Goal review and progress/challenges noted with patient.  Next appointment within 2 to 3 weeks.  This record has been created using [employer].  Chart creation errors have been sought, but may not always have been located and corrected.  Such creation errors do not reflect on the standard of medical care provided.   Kristine Ace, LCSW

## 2022-05-13 NOTE — Telephone Encounter (Signed)
Viibryd Rx sent to requested pharmacy. Temazepam is not due until 12/4.

## 2022-05-13 NOTE — Telephone Encounter (Signed)
Pt lvm that she needs a refill on her vilazodone 40 mg . It needs to be sent to the [facility] [address] in [place]. She also needs a refill on her temazapam 30 mg. She needs that refill to be sent to the [facility] on [address] in [place].

## 2022-05-14 DIAGNOSIS — M1712 Unilateral primary osteoarthritis, left knee: Secondary | ICD-10-CM | POA: Diagnosis not present

## 2022-05-14 DIAGNOSIS — M170 Bilateral primary osteoarthritis of knee: Secondary | ICD-10-CM | POA: Diagnosis not present

## 2022-05-14 DIAGNOSIS — M1711 Unilateral primary osteoarthritis, right knee: Secondary | ICD-10-CM | POA: Diagnosis not present

## 2022-05-15 DIAGNOSIS — M79672 Pain in left foot: Secondary | ICD-10-CM | POA: Diagnosis not present

## 2022-05-15 DIAGNOSIS — M5459 Other low back pain: Secondary | ICD-10-CM | POA: Diagnosis not present

## 2022-05-15 DIAGNOSIS — R2689 Other abnormalities of gait and mobility: Secondary | ICD-10-CM | POA: Diagnosis not present

## 2022-05-15 DIAGNOSIS — M79671 Pain in right foot: Secondary | ICD-10-CM | POA: Diagnosis not present

## 2022-05-19 ENCOUNTER — Other Ambulatory Visit: Payer: Self-pay

## 2022-05-19 DIAGNOSIS — F5105 Insomnia due to other mental disorder: Secondary | ICD-10-CM

## 2022-05-19 MED ORDER — TEMAZEPAM 30 MG PO CAPS: ORAL_CAPSULE | ORAL | 0 refills | Status: DC

## 2022-05-19 NOTE — Telephone Encounter (Signed)
Pended.

## 2022-05-26 DIAGNOSIS — M5451 Vertebrogenic low back pain: Secondary | ICD-10-CM | POA: Diagnosis not present

## 2022-05-27 DIAGNOSIS — M79671 Pain in right foot: Secondary | ICD-10-CM | POA: Diagnosis not present

## 2022-05-27 DIAGNOSIS — M79672 Pain in left foot: Secondary | ICD-10-CM | POA: Diagnosis not present

## 2022-05-27 DIAGNOSIS — R2689 Other abnormalities of gait and mobility: Secondary | ICD-10-CM | POA: Diagnosis not present

## 2022-05-27 DIAGNOSIS — M5459 Other low back pain: Secondary | ICD-10-CM | POA: Diagnosis not present

## 2022-05-29 DIAGNOSIS — M79672 Pain in left foot: Secondary | ICD-10-CM | POA: Diagnosis not present

## 2022-05-29 DIAGNOSIS — M5459 Other low back pain: Secondary | ICD-10-CM | POA: Diagnosis not present

## 2022-05-29 DIAGNOSIS — R2689 Other abnormalities of gait and mobility: Secondary | ICD-10-CM | POA: Diagnosis not present

## 2022-05-29 DIAGNOSIS — M79671 Pain in right foot: Secondary | ICD-10-CM | POA: Diagnosis not present

## 2022-06-19 DIAGNOSIS — K080 Exfoliation of teeth due to systemic causes: Secondary | ICD-10-CM | POA: Diagnosis not present

## 2022-06-23 ENCOUNTER — Other Ambulatory Visit: Payer: Self-pay | Admitting: Psychiatry

## 2022-06-23 ENCOUNTER — Telehealth: Payer: Self-pay | Admitting: Psychiatry

## 2022-06-23 DIAGNOSIS — F5105 Insomnia due to other mental disorder: Secondary | ICD-10-CM

## 2022-06-23 DIAGNOSIS — F3342 Major depressive disorder, recurrent, in full remission: Secondary | ICD-10-CM

## 2022-06-23 NOTE — Telephone Encounter (Signed)
Next visit is 07/21/22.Kristine Martinez is requesting a refill on her Hydroxyzine #90 days called in at   [place], [address] AT [address]   Phone: [phone]  Fax: [phone]

## 2022-06-23 NOTE — Telephone Encounter (Signed)
Addressed with pharmacy refill request.

## 2022-06-26 DIAGNOSIS — K080 Exfoliation of teeth due to systemic causes: Secondary | ICD-10-CM | POA: Diagnosis not present

## 2022-07-01 ENCOUNTER — Encounter: Payer: Self-pay | Admitting: Family Medicine

## 2022-07-01 DIAGNOSIS — R7303 Prediabetes: Secondary | ICD-10-CM | POA: Diagnosis not present

## 2022-07-01 DIAGNOSIS — R5381 Other malaise: Secondary | ICD-10-CM | POA: Diagnosis not present

## 2022-07-01 DIAGNOSIS — N1831 Chronic kidney disease, stage 3a: Secondary | ICD-10-CM | POA: Diagnosis not present

## 2022-07-01 DIAGNOSIS — E039 Hypothyroidism, unspecified: Secondary | ICD-10-CM | POA: Diagnosis not present

## 2022-07-07 ENCOUNTER — Telehealth: Payer: Self-pay | Admitting: Psychiatry

## 2022-07-07 NOTE — Telephone Encounter (Signed)
Filled 04/16/22 for 90 days due 1/30

## 2022-07-07 NOTE — Telephone Encounter (Signed)
Pt LVM 1/20@ 10:12p.  She would like refill of Methylphenidate XR '36mg'$  sent to [facility].  She said she has 9 days left.  She would like refill of 180 tablets.    Next appt 2/5

## 2022-07-14 ENCOUNTER — Other Ambulatory Visit: Payer: Self-pay

## 2022-07-14 DIAGNOSIS — F900 Attention-deficit hyperactivity disorder, predominantly inattentive type: Secondary | ICD-10-CM

## 2022-07-14 DIAGNOSIS — M5459 Other low back pain: Secondary | ICD-10-CM | POA: Diagnosis not present

## 2022-07-14 DIAGNOSIS — R2689 Other abnormalities of gait and mobility: Secondary | ICD-10-CM | POA: Diagnosis not present

## 2022-07-14 DIAGNOSIS — M79671 Pain in right foot: Secondary | ICD-10-CM | POA: Diagnosis not present

## 2022-07-14 DIAGNOSIS — M79672 Pain in left foot: Secondary | ICD-10-CM | POA: Diagnosis not present

## 2022-07-14 MED ORDER — METHYLPHENIDATE HCL ER (OSM) 36 MG PO TBCR: 72.0000 mg | EXTENDED_RELEASE_TABLET | Freq: Every day | ORAL | 0 refills | Status: DC

## 2022-07-14 NOTE — Telephone Encounter (Signed)
Pended.

## 2022-07-21 ENCOUNTER — Ambulatory Visit (INDEPENDENT_AMBULATORY_CARE_PROVIDER_SITE_OTHER): Payer: Medicare Other | Admitting: Psychiatry

## 2022-07-21 ENCOUNTER — Encounter: Payer: Self-pay | Admitting: Psychiatry

## 2022-07-21 VITALS — BP 113/76

## 2022-07-21 DIAGNOSIS — Z6379 Other stressful life events affecting family and household: Secondary | ICD-10-CM

## 2022-07-21 DIAGNOSIS — F900 Attention-deficit hyperactivity disorder, predominantly inattentive type: Secondary | ICD-10-CM

## 2022-07-21 DIAGNOSIS — F411 Generalized anxiety disorder: Secondary | ICD-10-CM | POA: Diagnosis not present

## 2022-07-21 DIAGNOSIS — F3342 Major depressive disorder, recurrent, in full remission: Secondary | ICD-10-CM

## 2022-07-21 DIAGNOSIS — F331 Major depressive disorder, recurrent, moderate: Secondary | ICD-10-CM

## 2022-07-21 DIAGNOSIS — F5105 Insomnia due to other mental disorder: Secondary | ICD-10-CM

## 2022-07-21 MED ORDER — HYDROXYZINE PAMOATE 25 MG PO CAPS: 25.0000 mg | ORAL_CAPSULE | Freq: Every evening | ORAL | 1 refills | Status: DC

## 2022-07-21 MED ORDER — VILAZODONE HCL 40 MG PO TABS: 40.0000 mg | ORAL_TABLET | Freq: Every day | ORAL | 1 refills | Status: DC

## 2022-07-21 MED ORDER — TEMAZEPAM 30 MG PO CAPS: ORAL_CAPSULE | ORAL | 1 refills | Status: DC

## 2022-07-21 MED ORDER — ARIPIPRAZOLE 5 MG PO TABS: 5.0000 mg | ORAL_TABLET | Freq: Every day | ORAL | 1 refills | Status: DC

## 2022-07-21 NOTE — Progress Notes (Signed)
Kristine Martinez [id number] [birth year] 67 y.o.  Subjective:   Patient ID:  Kristine Martinez is a 67 y.o. (DOB 05-09-1956) female.  Chief Complaint:  Chief Complaint  Patient presents with   Follow-up   Depression   ADHD   Anxiety    Kristine Martinez presents to the office today for follow-up of ADD and depression.   seen August 19, 2018.  Because of concerns about attention and focus Concerta was increased to 36 mg every morning.  The other medications were continued without change. Doesn't get tasks done.  Can't make myself do it.  Denies feeling depressed despite stressors.  Enjoys gkids and reading. Trouble staying focused.  Wants to increase Concerta.  seen March 16, 2019.  Kristine Martinez wanted to increase the Concerta therefore was increased to 54 mg daily.  The other meds were left unchanged. Generally Good depression and anxiety response with Viibryd 40 + Abilify 5.  No SE  seen June 22, 2019.  Kristine Martinez was doing okay with regard to mood and anxiety but did not notice sufficient benefit with the increase in Concerta to 54 mg daily.  Therefore the dosage was increased to 72 mg daily. Increase in Concerta noticed a little change but not as much as Kristine Martinez'd like to be with productivity.  It's a little better.  GKids staying with her with son temporarily.    Patient reports stable mood and denies depressed or irritable moods.  Patient denies any recent difficulty with anxiety.  Patient denies difficulty with sleep initiation or maintenance but recent anniversary nightmares.  Overall fewer.  7-8 hours.  Otherwise Restoril helps.  Has awoken screaming.  This is unusual.  Been watching murder mysteries. Denies appetite disturbance.  Patient reports that energy and motivation have been good.    Patient denies any suicidal ideation. Plan no med changes.  04/19/20 appt with following noted: Medicare in Feb.  Picked plan and won't cover Concerta nor Viibryd.  Disc this at length.   Still doing well with the meds.   Pleased and doesn't want to change.  Holidays are hard and doesn't want to change at holidays. Plan: BC partial response continue Concerta 72 mg daily Good depression and anxiety response with Viibryd 40 + Abilify 5.   08/14/2020 appointment with following noted: Disc insurance problems with hydroxyzine and disc reasons why they give a hassle about it.  Needs it to help sleep.  Generally sleep good with occ bad dreams. Still good mood and pleased with meds.  Able to stay on Viibryd and Concerta now that turned 67 yo. Foggy tired and can't function with out Concerta.  Helps focus and productivity. Questions about how to get refills.   3 alcoholic sons.  One got rehab and is sober.  One ruined Public house manager. Active [employer] helps. Quit Pepsi and Mtn Dew. Patient reports stable mood and denies depressed or irritable moods.  Patient denies any recent difficulty with anxiety.  Patient denies difficulty with sleep initiation or maintenance. Denies appetite disturbance.  Patient reports that energy and motivation have been good.  Patient denies any difficulty with concentration.  Patient denies any suicidal ideation.  01/14/2021 appt noted: Need another insurance company.  Mail lost $500 dollars of meds and problems with [employer]. Doesn't want to change from [place]. Meds working for her and doesn't want change.   Disc insurance problems with hydroxyzine and disc reasons why they give a hassle about it.  Needs it to help sleep.  Patient reports stable mood  and denies depressed or irritable moods.  Patient denies any recent difficulty with anxiety.  Patient denies difficulty with sleep initiation or maintenance. Denies appetite disturbance.  Patient reports that energy and motivation have been good.  Patient denies any difficulty with concentration.  Patient denies any suicidal ideation. Doing well with stimulants. No SE Repeat liver enzymes normal Plan: no med changes  05/21/21 appt noted: Continues  med. Stressed bc living with 2 alcoholic sons.  Pray a lot and stay in room a lot. Started counseling helpful twice. Continues viibryd 40, Abilify 5 mg, temazepam 30 HS,Hydroxyzine 25 HS, concerta 36 mg AM No SE. Plan: No med changes  10/08/2021 appointment with the following noted: Disc trouble getting Concerta.   Asks about alternatives.  Satisfied with meds otherwise. Patient reports stable mood and denies depressed or irritable moods.  Patient denies any recent difficulty with anxiety but stressed..  Patient denies difficulty with sleep initiation or maintenance. Denies appetite disturbance.  Patient reports that energy and motivation have been good.  Patient denies any difficulty with concentration.  Patient denies any suicidal ideation. Plan: No med changes Continues viibryd 40, Abilify 5 mg, temazepam 30 HS,Hydroxyzine 25 HS, concerta 72 mg AM  49/4/49 appt noted: All kinds of family issues causing anxiety.  Had to get a restraining order on kid whose alcoholic. Don't want to change meds.  Prayer and faith helps.  Unmotivated for 3 weeks. Sleep with door locked for fear of drunk son. Sleep good with meds.  Takes temazepam and hydroxyzine. No SE.   PCP dx DM and increased thyroid med. Plan: No med changes Continues viibryd 40, Abilify 5 mg, temazepam 30 HS,Hydroxyzine 25 HS, concerta 72 mg AM  11/20/65 app noted: Son detox twice then rehab [place] and for now better but lost license. Kristine Martinez is having a little bit of the break.  He's acknowledging problem.  Homeless and hitting his bottom. He's at the [facility] [place].  Kristine Martinez feels it's a very good problem. Kristine Martinez continues meds. He's had SZ all alcoholic related.  Kristine Martinez feels calm and handling it.  Kristine Martinez's participated in family tx. Sleep is good with meds.   Concerta is about the same.  And still helpful. No SE.   Past Psychiatric Medication Trials: Vyvanse, Concerta 72,  Ambien side effects, trazodone, prazosin, hydroxyzine, Lunesta,  temazepam 30 Viibryd, duloxetine 90, Wellbutrin 450, fluoxetine,   Review of Systems:  Review of Systems  Cardiovascular:  Negative for palpitations.  Musculoskeletal:  Positive for arthralgias and gait problem.  Neurological:  Negative for dizziness and tremors.  Psychiatric/Behavioral:  Positive for decreased concentration. Negative for agitation, behavioral problems, confusion, dysphoric mood, hallucinations, self-injury, sleep disturbance and suicidal ideas. The patient is not nervous/anxious and is not hyperactive.   Normal BP  Medications: I have reviewed the patient's current medications.  Current Outpatient Medications  Medication Sig Dispense Refill   Calcium Carbonate-Vitamin D (CALTRATE 600+D PO) Take by mouth.     calcium citrate-vitamin D (CITRACAL+D) 315-200 MG-UNIT tablet Take 1 tablet by mouth 2 (two) times daily.     empagliflozin (JARDIANCE) 10 MG TABS tablet Take by mouth daily.     levothyroxine (SYNTHROID) 100 MCG tablet Take 1 tablet (100 mcg total) by mouth daily before breakfast. 60 tablet 1   methylphenidate (CONCERTA) 36 MG PO CR tablet Take 2 tablets (72 mg total) by mouth daily. 180 tablet 0   methylphenidate 36 MG PO CR tablet methylphenidate ER 36 mg tablet,extended release 24 hr  omeprazole (PRILOSEC OTC) 20 MG tablet Take 20 mg by mouth daily.     pantoprazole (PROTONIX) 40 MG tablet Take 1 tablet (40 mg total) by mouth daily. 90 tablet 1   thyroid (ARMOUR) 60 MG tablet      ARIPiprazole (ABILIFY) 5 MG tablet Take 1 tablet (5 mg total) by mouth daily. 90 tablet 1   hydrOXYzine (VISTARIL) 25 MG capsule Take 1 capsule (25 mg total) by mouth at bedtime. 90 capsule 1   temazepam (RESTORIL) 30 MG capsule TAKE 1 CAPSULE(30 MG) BY MOUTH AT BEDTIME 90 capsule 1   Vilazodone HCl (VIIBRYD) 40 MG TABS Take 1 tablet (40 mg total) by mouth daily. 90 tablet 1   No current facility-administered medications for this visit.    Medication Side Effects:  None  Allergies: No Known Allergies  Past Medical History:  Diagnosis Date   Allergic rhinitis    Attention deficit disorder (ADD) without hyperactivity    Chronic kidney disease (CKD), active medical management without dialysis, stage 3 (moderate) (HCC)    Depression    Gastro-esophageal reflux disease without esophagitis    High cholesterol    Hypothyroidism    Migraine with aura    NASH (nonalcoholic steatohepatitis)    OA (osteoarthritis)    Obesity    Osteopenia    Prediabetes    Sacroiliitis (HCC)    Sinusitis    Vitamin B12 deficiency    Vitamin D deficiency     Family History  Problem Relation Age of Onset   Alzheimer's disease Maternal Aunt     Social History   Socioeconomic History   Marital status: Widowed    Spouse name: Not on file   Number of children: Not on file   Years of education: Not on file   Highest education level: Not on file  Occupational History   Not on file  Tobacco Use   Smoking status: Never   Smokeless tobacco: Never  Substance and Sexual Activity   Alcohol use: Not on file   Drug use: Not on file   Sexual activity: Not on file  Other Topics Concern   Not on file  Social History Narrative   Not on file   Social Determinants of Health   Financial Resource Strain: Not on file  Food Insecurity: Not on file  Transportation Needs: Not on file  Physical Activity: Not on file  Stress: Not on file  Social Connections: Not on file  Intimate Partner Violence: Not on file    Past Medical History, Surgical history, Social history, and Family history were reviewed and updated as appropriate.   Please see review of systems for further details on the patient's review from today.   Objective:   Physical Exam:  BP 113/76   Physical Exam Constitutional:      General: Kristine Martinez is not in acute distress.    Appearance: Kristine Martinez is well-developed. Kristine Martinez is obese.  Musculoskeletal:        General: No deformity.  Neurological:     Mental  Status: Kristine Martinez is alert and oriented to person, place, and time.     Motor: No tremor.     Coordination: Coordination normal.     Gait: Gait normal.  Psychiatric:        Attention and Perception: Kristine Martinez is attentive.        Mood and Affect: Mood is anxious and depressed. Affect is not labile, blunt, angry or tearful.        Speech: Speech  normal.        Behavior: Behavior normal. Behavior is not slowed.        Thought Content: Thought content normal. Thought content is not delusional. Thought content does not include homicidal or suicidal ideation.        Cognition and Memory: Cognition normal.        Judgment: Judgment normal.     Comments: Insight is good. No AIM     Lab Review:  No results found for: "NA", "K", "CL", "CO2", "GLUCOSE", "BUN", "CREATININE", "CALCIUM", "PROT", "ALBUMIN", "AST", "ALT", "ALKPHOS", "BILITOT", "GFRNONAA", "GFRAA"     Component Value Date/Time   WBC 6.2 01/16/2014 0843   RBC 4.74 01/16/2014 0843   HGB 13.2 01/16/2014 0843   HCT 38.9 01/16/2014 0843   PLT 212 01/16/2014 0843   MCV 82.1 01/16/2014 0843   MCH 27.8 01/16/2014 0843   MCHC 33.9 01/16/2014 0843   RDW 15.5 01/16/2014 0843    No results found for: "POCLITH", "LITHIUM"   No results found for: "PHENYTOIN", "PHENOBARB", "VALPROATE", "CBMZ"   .res Assessment: Plan:    Major depressive disorder, recurrent episode, moderate (HCC)  Generalized anxiety disorder  Attention deficit hyperactivity disorder (ADHD), predominantly inattentive type  Insomnia due to mental condition - Plan: temazepam (RESTORIL) 30 MG capsule  Stressful life events affecting family and household  Recurrent major depression in complete remission ([place]) - Plan: ARIPiprazole (ABILIFY) 5 MG tablet, Vilazodone HCl (VIIBRYD) 40 MG TABS   Greater than 50% of 30 min face to face time with patient was spent on counseling and coordination of care. We discussed Educated difference between anhedonic depression with poor attention  and ADHD and overlap of sx in detail.  Kristine Martinez doesn't believe Kristine Martinez's depressed.  Disc drug pricing for generics and GoodRx.  Extensived Disc differnence between insurance plans and coverage of meds. Meds are unique and there aren't cheap alternatives.  BC partial response continue Concerta 72 mg daily. Tolerating last increase. BP and pulse were OK.   Disc the shortage and Kristine Martinez asks about alternatives but not a good generic long acting option. Can't mange TID dosing of stimulants. Discussed potential benefits, risks, and side effects of stimulants with patient to include increased heart rate, palpitations, insomnia, increased anxiety, increased irritability, or decreased appetite.  Instructed patient to contact office if experiencing any significant tolerability issues.  Good depression and anxiety response with Viibryd 40 + Abilify 5.  No SE  Tendency towards anxiety much better.  Discussed potential metabolic side effects associated with atypical antipsychotics, as well as potential risk for movement side effects. Advised pt to contact office if movement side effects occur.   Good response with sleep meds still. Sleep good overall with occ NM.  Not enough to restart NM meds. Hydroxyzine helps but still needs temazepam. Disc normal sleep stages.     Patient has a history of elevated liver enzymes which have returned to normal lately.  Kristine Martinez has not had significant weight loss.  Answered questions about meds and elevated liver enzymes as well as her diagnosis of chronic kidney disease stage II or III and psychiatric medications.  Kristine Martinez is not taking any psychiatric medications that should affect her kidneys.  Disc counseling.  Kristine Martinez's been to [place] her labs and prediabetes and kidney function.  No med changes Continues viibryd 40, Abilify 5 mg, temazepam 30 HS,Hydroxyzine 25 HS, concerta 72 mg AM  Fu 6 mos  Lynder Parents, MD, DFAPA    Please see After Visit Summary  for patient specific  instructions.  No future appointments.   No orders of the defined types were placed in this encounter.     -------------------------------

## 2022-07-28 ENCOUNTER — Telehealth: Payer: Self-pay | Admitting: Psychiatry

## 2022-07-28 ENCOUNTER — Telehealth: Payer: Self-pay

## 2022-07-28 DIAGNOSIS — F3342 Major depressive disorder, recurrent, in full remission: Secondary | ICD-10-CM

## 2022-07-28 MED ORDER — ARIPIPRAZOLE 5 MG PO TABS: 5.0000 mg | ORAL_TABLET | Freq: Every day | ORAL | 1 refills | Status: DC

## 2022-07-28 NOTE — Telephone Encounter (Signed)
Rx had been sent on 2/5 to a different pharmacy.  Patient is aware and said there was an error on her part, but she will contact [facility].

## 2022-07-28 NOTE — Telephone Encounter (Signed)
Berit called back 10:27am. States that [facility] told her the Abilify will need a PA- She also researched it and the cost will be less for her at [facility] on [address]. Can we resend the Abilify to [place] and work on starting a PA for it with insurance.

## 2022-07-28 NOTE — Telephone Encounter (Signed)
I don't see anything in CMM, but I think there is a fax from the pharmacy in your folder.  Rx sent to [facility] as requested.

## 2022-07-28 NOTE — Telephone Encounter (Signed)
Katanya lvm requesting a refill on her Abilify. She is going out of town and will run out. Please fill at the [facility] on [address] per her request. Last seen on 2/5 with a follow up scheduled for 01/20/23. Contact # [phone]

## 2022-07-29 NOTE — Telephone Encounter (Signed)
Prior Approval received for Aripiprazole 5 mg through 07/30/2023 with [employer] Medicare Part D

## 2022-07-29 NOTE — Telephone Encounter (Signed)
Mailbox is full, will send MyChart message.

## 2022-08-18 ENCOUNTER — Telehealth: Payer: Self-pay | Admitting: Psychiatry

## 2022-08-18 ENCOUNTER — Other Ambulatory Visit: Payer: Self-pay

## 2022-08-18 DIAGNOSIS — F5105 Insomnia due to other mental disorder: Secondary | ICD-10-CM

## 2022-08-18 MED ORDER — TEMAZEPAM 30 MG PO CAPS: ORAL_CAPSULE | ORAL | 1 refills | Status: DC

## 2022-08-18 NOTE — Telephone Encounter (Signed)
Pended.

## 2022-08-18 NOTE — Telephone Encounter (Signed)
Pt requesting Rx for Temazepam 30 mg #90 [facility]. [address]

## 2022-08-25 DIAGNOSIS — Z6837 Body mass index (BMI) 37.0-37.9, adult: Secondary | ICD-10-CM | POA: Diagnosis not present

## 2022-08-25 DIAGNOSIS — M461 Sacroiliitis, not elsewhere classified: Secondary | ICD-10-CM | POA: Diagnosis not present

## 2022-08-25 DIAGNOSIS — Z111 Encounter for screening for respiratory tuberculosis: Secondary | ICD-10-CM | POA: Diagnosis not present

## 2022-09-02 DIAGNOSIS — M79672 Pain in left foot: Secondary | ICD-10-CM | POA: Diagnosis not present

## 2022-09-02 DIAGNOSIS — M5459 Other low back pain: Secondary | ICD-10-CM | POA: Diagnosis not present

## 2022-09-02 DIAGNOSIS — R2689 Other abnormalities of gait and mobility: Secondary | ICD-10-CM | POA: Diagnosis not present

## 2022-09-02 DIAGNOSIS — M79671 Pain in right foot: Secondary | ICD-10-CM | POA: Diagnosis not present

## 2022-09-04 DIAGNOSIS — M79672 Pain in left foot: Secondary | ICD-10-CM | POA: Diagnosis not present

## 2022-09-04 DIAGNOSIS — R2689 Other abnormalities of gait and mobility: Secondary | ICD-10-CM | POA: Diagnosis not present

## 2022-09-04 DIAGNOSIS — M5459 Other low back pain: Secondary | ICD-10-CM | POA: Diagnosis not present

## 2022-09-04 DIAGNOSIS — M79671 Pain in right foot: Secondary | ICD-10-CM | POA: Diagnosis not present

## 2022-09-08 DIAGNOSIS — K080 Exfoliation of teeth due to systemic causes: Secondary | ICD-10-CM | POA: Diagnosis not present

## 2022-09-10 ENCOUNTER — Other Ambulatory Visit: Payer: Self-pay | Admitting: Psychiatry

## 2022-09-10 DIAGNOSIS — M79671 Pain in right foot: Secondary | ICD-10-CM | POA: Diagnosis not present

## 2022-09-10 DIAGNOSIS — M5459 Other low back pain: Secondary | ICD-10-CM | POA: Diagnosis not present

## 2022-09-10 DIAGNOSIS — R2689 Other abnormalities of gait and mobility: Secondary | ICD-10-CM | POA: Diagnosis not present

## 2022-09-10 DIAGNOSIS — M79672 Pain in left foot: Secondary | ICD-10-CM | POA: Diagnosis not present

## 2022-09-10 DIAGNOSIS — F5105 Insomnia due to other mental disorder: Secondary | ICD-10-CM

## 2022-10-10 ENCOUNTER — Other Ambulatory Visit: Payer: Self-pay | Admitting: Psychiatry

## 2022-10-10 DIAGNOSIS — F900 Attention-deficit hyperactivity disorder, predominantly inattentive type: Secondary | ICD-10-CM

## 2022-10-10 NOTE — Telephone Encounter (Signed)
Pt left message requesting Rx for Methylphenidate 36 mg PO CR #180 @ [facility] [address]. Need to  pick up  5/1 son's wedding in [place] and will be there for 4 days. Pt contact [phone]

## 2022-10-10 NOTE — Telephone Encounter (Signed)
LF 2/1 for 90 days, due 4/29

## 2022-10-13 ENCOUNTER — Telehealth: Payer: Self-pay | Admitting: Psychiatry

## 2022-10-13 MED ORDER — METHYLPHENIDATE HCL ER (OSM) 36 MG PO TBCR: 72.0000 mg | EXTENDED_RELEASE_TABLET | Freq: Every day | ORAL | 0 refills | Status: DC

## 2022-10-13 NOTE — Telephone Encounter (Signed)
Pended.

## 2023-01-07 DIAGNOSIS — R7303 Prediabetes: Secondary | ICD-10-CM | POA: Diagnosis not present

## 2023-01-07 DIAGNOSIS — E7800 Pure hypercholesterolemia, unspecified: Secondary | ICD-10-CM | POA: Diagnosis not present

## 2023-01-07 DIAGNOSIS — E039 Hypothyroidism, unspecified: Secondary | ICD-10-CM | POA: Diagnosis not present

## 2023-01-07 DIAGNOSIS — N1831 Chronic kidney disease, stage 3a: Secondary | ICD-10-CM | POA: Diagnosis not present

## 2023-01-07 DIAGNOSIS — E538 Deficiency of other specified B group vitamins: Secondary | ICD-10-CM | POA: Diagnosis not present

## 2023-01-07 DIAGNOSIS — E559 Vitamin D deficiency, unspecified: Secondary | ICD-10-CM | POA: Diagnosis not present

## 2023-01-08 ENCOUNTER — Other Ambulatory Visit: Payer: Self-pay | Admitting: Psychiatry

## 2023-01-08 ENCOUNTER — Telehealth: Payer: Self-pay | Admitting: Psychiatry

## 2023-01-08 DIAGNOSIS — F900 Attention-deficit hyperactivity disorder, predominantly inattentive type: Secondary | ICD-10-CM

## 2023-01-08 MED ORDER — METHYLPHENIDATE HCL ER (OSM) 36 MG PO TBCR: 72.0000 mg | EXTENDED_RELEASE_TABLET | Freq: Every day | ORAL | 0 refills | Status: DC

## 2023-01-08 NOTE — Telephone Encounter (Signed)
Pended.

## 2023-01-08 NOTE — Telephone Encounter (Signed)
Pt requesting Rx generic concerta 36 mg tablets for 90 day to [facility]  [phone]

## 2023-01-12 ENCOUNTER — Encounter: Payer: Self-pay | Admitting: Family Medicine

## 2023-01-12 DIAGNOSIS — E039 Hypothyroidism, unspecified: Secondary | ICD-10-CM | POA: Diagnosis not present

## 2023-01-12 DIAGNOSIS — R7303 Prediabetes: Secondary | ICD-10-CM | POA: Diagnosis not present

## 2023-01-12 DIAGNOSIS — N1831 Chronic kidney disease, stage 3a: Secondary | ICD-10-CM | POA: Diagnosis not present

## 2023-01-12 DIAGNOSIS — Z0000 Encounter for general adult medical examination without abnormal findings: Secondary | ICD-10-CM | POA: Diagnosis not present

## 2023-01-12 DIAGNOSIS — E538 Deficiency of other specified B group vitamins: Secondary | ICD-10-CM | POA: Diagnosis not present

## 2023-01-17 ENCOUNTER — Other Ambulatory Visit: Payer: Self-pay | Admitting: Psychiatry

## 2023-01-17 DIAGNOSIS — F3342 Major depressive disorder, recurrent, in full remission: Secondary | ICD-10-CM

## 2023-01-18 NOTE — Telephone Encounter (Signed)
Has appt 8/6

## 2023-01-20 ENCOUNTER — Ambulatory Visit (INDEPENDENT_AMBULATORY_CARE_PROVIDER_SITE_OTHER): Payer: Medicare Other | Admitting: Psychiatry

## 2023-01-20 ENCOUNTER — Encounter: Payer: Self-pay | Admitting: Psychiatry

## 2023-01-20 DIAGNOSIS — F411 Generalized anxiety disorder: Secondary | ICD-10-CM | POA: Diagnosis not present

## 2023-01-20 DIAGNOSIS — F5105 Insomnia due to other mental disorder: Secondary | ICD-10-CM

## 2023-01-20 DIAGNOSIS — F3342 Major depressive disorder, recurrent, in full remission: Secondary | ICD-10-CM | POA: Diagnosis not present

## 2023-01-20 DIAGNOSIS — F900 Attention-deficit hyperactivity disorder, predominantly inattentive type: Secondary | ICD-10-CM

## 2023-01-20 MED ORDER — METHYLPHENIDATE HCL ER (OSM) 36 MG PO TBCR
72.0000 mg | EXTENDED_RELEASE_TABLET | Freq: Every day | ORAL | 0 refills | Status: DC
Start: 2023-01-20 — End: 2023-05-25

## 2023-01-20 MED ORDER — ARIPIPRAZOLE 5 MG PO TABS
5.0000 mg | ORAL_TABLET | Freq: Every day | ORAL | 1 refills | Status: DC
Start: 2023-01-20 — End: 2023-05-12

## 2023-01-20 MED ORDER — BUPROPION HCL ER (XL) 150 MG PO TB24: ORAL_TABLET | ORAL | 0 refills | Status: DC

## 2023-01-20 NOTE — Progress Notes (Signed)
Kristine Martinez [id number] 12/29/1955 67 y.o.  Subjective:   Patient ID:  Kristine Martinez is a 67 y.o. (DOB [birth year]) female.  Chief Complaint:  Chief Complaint  Patient presents with   Follow-up   Depression   Anxiety   Fatigue    Kristine Martinez presents to the office today for follow-up of ADD and depression.   seen August 19, 2018.  Because of concerns about attention and focus Concerta was increased to 36 mg every morning.  The other medications were continued without change. Doesn't get tasks done.  Can't make myself do it.  Denies feeling depressed despite stressors.  Enjoys gkids and reading. Trouble staying focused.  Wants to increase Concerta.  seen March 16, 2019.  She wanted to increase the Concerta therefore was increased to 54 mg daily.  The other meds were left unchanged. Generally Good depression and anxiety response with Viibryd 40 + Abilify 5.  No SE  seen June 22, 2019.  She was doing okay with regard to mood and anxiety but did not notice sufficient benefit with the increase in Concerta to 54 mg daily.  Therefore the dosage was increased to 72 mg daily. Increase in Concerta noticed a little change but not as much as she'd like to be with productivity.  It's a little better.  GKids staying with her with son temporarily.    Patient reports stable mood and denies depressed or irritable moods.  Patient denies any recent difficulty with anxiety.  Patient denies difficulty with sleep initiation or maintenance but recent anniversary nightmares.  Overall fewer.  7-8 hours.  Otherwise Restoril helps.  Has awoken screaming.  This is unusual.  Been watching murder mysteries. Denies appetite disturbance.  Patient reports that energy and motivation have been good.    Patient denies any suicidal ideation. Plan no med changes.  04/19/20 appt with following noted: Medicare in Feb.  Picked plan and won't cover Concerta nor Viibryd.  Disc this at length.   Still doing well with the meds.   Pleased and doesn't want to change.  Holidays are hard and doesn't want to change at holidays. Plan: BC partial response continue Concerta 72 mg daily Good depression and anxiety response with Viibryd 40 + Abilify 5.   08/14/2020 appointment with following noted: Disc insurance problems with hydroxyzine and disc reasons why they give a hassle about it.  Needs it to help sleep.  Generally sleep good with occ bad dreams. Still good mood and pleased with meds.  Able to stay on Viibryd and Concerta now that turned 67 yo. Foggy tired and can't function with out Concerta.  Helps focus and productivity. Questions about how to get refills.   3 alcoholic sons.  One got rehab and is sober.  One ruined Teacher, adult education. Active [employer] helps. Quit Pepsi and Mtn Dew. Patient reports stable mood and denies depressed or irritable moods.  Patient denies any recent difficulty with anxiety.  Patient denies difficulty with sleep initiation or maintenance. Denies appetite disturbance.  Patient reports that energy and motivation have been good.  Patient denies any difficulty with concentration.  Patient denies any suicidal ideation.  01/14/2021 appt noted: Need another insurance company.  Mail lost $500 dollars of meds and problems with [employer]. Doesn't want to change from Viibryd. Meds working for her and doesn't want change.   Disc insurance problems with hydroxyzine and disc reasons why they give a hassle about it.  Needs it to help sleep.  Patient reports stable mood  and denies depressed or irritable moods.  Patient denies any recent difficulty with anxiety.  Patient denies difficulty with sleep initiation or maintenance. Denies appetite disturbance.  Patient reports that energy and motivation have been good.  Patient denies any difficulty with concentration.  Patient denies any suicidal ideation. Doing well with stimulants. No SE Repeat liver enzymes normal Plan: no med changes  05/21/21 appt noted: Continues  med. Stressed bc living with 2 alcoholic sons.  Pray a lot and stay in room a lot. Started counseling helpful twice. Continues viibryd 40, Abilify 5 mg, temazepam 30 HS,Hydroxyzine 25 HS, concerta 36 mg AM No SE. Plan: No med changes  10/08/2021 appointment with the following noted: Disc trouble getting Concerta.   Asks about alternatives.  Satisfied with meds otherwise. Patient reports stable mood and denies depressed or irritable moods.  Patient denies any recent difficulty with anxiety but stressed..  Patient denies difficulty with sleep initiation or maintenance. Denies appetite disturbance.  Patient reports that energy and motivation have been good.  Patient denies any difficulty with concentration.  Patient denies any suicidal ideation. Plan: No med changes Continues viibryd 40, Abilify 5 mg, temazepam 30 HS,Hydroxyzine 25 HS, concerta 72 mg AM  03/17/22 appt noted: All kinds of family issues causing anxiety.  Had to get a restraining order on kid whose alcoholic. Don't want to change meds.  Prayer and faith helps.  Unmotivated for 3 weeks. Sleep with door locked for fear of drunk son. Sleep good with meds.  Takes temazepam and hydroxyzine. No SE.   PCP dx DM and increased thyroid med. Plan: No med changes Continues viibryd 40, Abilify 5 mg, temazepam 30 HS,Hydroxyzine 25 HS, concerta 72 mg AM  07/21/22 app noted: Son detox twice then rehab [place] and for now better but lost license. She is having a little bit of the break.  He's acknowledging problem.  Homeless and hitting his bottom. He's at the [facility] [place].  She feels it's a very good problem. sHe continues meds. He's had SZ all alcoholic related.  She feels calm and handling it.  She's participated in family tx. Sleep is good with meds.   Concerta is about the same.  And still helpful. No SE. Plan: No med changes Continues viibryd 40, Abilify 5 mg, temazepam 30 HS,Hydroxyzine 25 HS, concerta 72 mg AM  01/20/23 appt  noted:  Recent PE normal including TSH, B12, etc Complaining of difficulty doing things bc low intereest, energy, motivation. Wonders about Wellbutrin. Son in [place] px in [place].    Past Psychiatric Medication Trials: Vyvanse, Concerta 72,  Ambien side effects, trazodone, prazosin, hydroxyzine, Lunesta, temazepam 30 Viibryd, duloxetine 90, Wellbutrin 450, fluoxetine,   Review of Systems:  Review of Systems  Cardiovascular:  Negative for palpitations.  Musculoskeletal:  Positive for arthralgias and gait problem.  Neurological:  Negative for dizziness and tremors.  Psychiatric/Behavioral:  Positive for decreased concentration. Negative for agitation, behavioral problems, confusion, dysphoric mood, hallucinations, self-injury, sleep disturbance and suicidal ideas. The patient is not nervous/anxious and is not hyperactive.   Normal BP  Medications: I have reviewed the patient's current medications.  Current Outpatient Medications  Medication Sig Dispense Refill   buPROPion (WELLBUTRIN XL) 150 MG 24 hr tablet 1 in the AM for 1 week then 2 in the AM 30 tablet 0   Calcium Carbonate-Vitamin D (CALTRATE 600+D PO) Take by mouth.     calcium citrate-vitamin D (CITRACAL+D) 315-200 MG-UNIT tablet Take 1 tablet by mouth 2 (  two) times daily.     empagliflozin (JARDIANCE) 10 MG TABS tablet Take by mouth daily.     hydrOXYzine (ATARAX) 25 MG tablet TAKE 1 TABLET(25 MG) BY MOUTH AT BEDTIME AS NEEDED 90 tablet 2   hydrOXYzine (VISTARIL) 25 MG capsule TAKE 1 CAPSULE(25 MG) BY MOUTH AT BEDTIME 90 capsule 1   levothyroxine (SYNTHROID) 100 MCG tablet Take 1 tablet (100 mcg total) by mouth daily before breakfast. 60 tablet 1   methylphenidate 36 MG PO CR tablet methylphenidate ER 36 mg tablet,extended release 24 hr     omeprazole (PRILOSEC OTC) 20 MG tablet Take 20 mg by mouth daily.     pantoprazole (PROTONIX) 40 MG tablet Take 1 tablet (40 mg total) by mouth daily. 90 tablet 1   temazepam  (RESTORIL) 30 MG capsule TAKE 1 CAPSULE(30 MG) BY MOUTH AT BEDTIME 90 capsule 1   thyroid (ARMOUR) 60 MG tablet      Vilazodone HCl (VIIBRYD) 40 MG TABS Take 1 tablet (40 mg total) by mouth daily. 90 tablet 1   ARIPiprazole (ABILIFY) 5 MG tablet Take 1 tablet (5 mg total) by mouth daily. 90 tablet 1   methylphenidate (CONCERTA) 36 MG PO CR tablet Take 2 tablets (72 mg total) by mouth daily. 180 tablet 0   No current facility-administered medications for this visit.    Medication Side Effects: None  Allergies: No Known Allergies  Past Medical History:  Diagnosis Date   Allergic rhinitis    Attention deficit disorder (ADD) without hyperactivity    Chronic kidney disease (CKD), active medical management without dialysis, stage 3 (moderate) (HCC)    Depression    Gastro-esophageal reflux disease without esophagitis    High cholesterol    Hypothyroidism    Migraine with aura    NASH (nonalcoholic steatohepatitis)    OA (osteoarthritis)    Obesity    Osteopenia    Prediabetes    Sacroiliitis (HCC)    Sinusitis    Vitamin B12 deficiency    Vitamin D deficiency     Family History  Problem Relation Age of Onset   Alzheimer's disease Maternal Aunt     Social History   Socioeconomic History   Marital status: Widowed    Spouse name: Not on file   Number of children: Not on file   Years of education: Not on file   Highest education level: Not on file  Occupational History   Not on file  Tobacco Use   Smoking status: Never   Smokeless tobacco: Never  Substance and Sexual Activity   Alcohol use: Not on file   Drug use: Not on file   Sexual activity: Not on file  Other Topics Concern   Not on file  Social History Narrative   Not on file   Social Determinants of Health   Financial Resource Strain: Not on file  Food Insecurity: Not on file  Transportation Needs: Not on file  Physical Activity: Not on file  Stress: Not on file  Social Connections: Unknown (10/26/2021)    Received from [facility]   Social Network    Social Network: Not on file  Intimate Partner Violence: Unknown (09/17/2021)   Received from [facility]   HITS    Physically Hurt: Not on file    Insult or Talk Down To: Not on file    Threaten Physical Harm: Not on file    Scream or Curse: Not on file    Past Medical  History, Surgical history, Social history, and Family history were reviewed and updated as appropriate.   Please see review of systems for further details on the patient's review from today.   Objective:   Physical Exam:  There were no vitals taken for this visit.  Physical Exam Constitutional:      General: She is not in acute distress.    Appearance: She is well-developed. She is obese.  Musculoskeletal:        General: No deformity.  Neurological:     Mental Status: She is alert and oriented to person, place, and time.     Motor: No tremor.     Coordination: Coordination normal.     Gait: Gait normal.  Psychiatric:        Attention and Perception: She is attentive.        Mood and Affect: Mood is anxious and depressed. Affect is not labile, blunt, angry or tearful.        Speech: Speech normal.        Behavior: Behavior normal. Behavior is not slowed.        Thought Content: Thought content normal. Thought content is not delusional. Thought content does not include homicidal or suicidal ideation.        Cognition and Memory: Cognition normal.        Judgment: Judgment normal.     Comments: Insight is good. No AIM     Lab Review:  No results found for: "NA", "K", "CL", "CO2", "GLUCOSE", "BUN", "CREATININE", "CALCIUM", "PROT", "ALBUMIN", "AST", "ALT", "ALKPHOS", "BILITOT", "GFRNONAA", "GFRAA"     Component Value Date/Time   WBC 6.2 01/16/2014 0843   RBC 4.74 01/16/2014 0843   HGB 13.2 01/16/2014 0843   HCT 38.9 01/16/2014 0843   PLT 212 01/16/2014 0843   MCV 82.1 01/16/2014 0843   MCH 27.8 01/16/2014 0843   MCHC  33.9 01/16/2014 0843   RDW 15.5 01/16/2014 0843    No results found for: "POCLITH", "LITHIUM"   No results found for: "PHENYTOIN", "PHENOBARB", "VALPROATE", "CBMZ"   .res Assessment: Plan:    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 We discussed Educated difference between anhedonic depression with poor attention and ADHD and overlap of sx in detail.  She doesn't believe she's depressed.  Disc drug pricing for generics and GoodRx.  Extensived Disc differnence between insurance plans and coverage of meds. Meds are unique and there aren't cheap alternatives.  BC partial response continue Concerta 72 mg daily. Tolerating last increase. BP and pulse were OK.   Disc the shortage and she asks about alternatives but not a good generic long acting option. Can't mange TID dosing of stimulants. Discussed potential benefits, risks, and side effects of stimulants with patient to include increased heart rate, palpitations, insomnia, increased anxiety, increased irritability, or decreased appetite.  Instructed patient to contact office if experiencing any significant tolerability issues.  Good depression and anxiety response with Viibryd 40 + Abilify 5.  No SE  Tendency towards anxiety much better.  Discussed potential metabolic side effects associated with atypical antipsychotics, as well as potential  risk for movement side effects. Advised pt to contact office if movement side effects occur.   Good response with sleep meds still. Sleep good overall with occ NM.   Not enough to restart NM meds. Hydroxyzine helps but still needs temazepam. Disc normal sleep stages.     Patient has a history of elevated liver enzymes which have returned to normal lately.  She has not had significant weight loss.  Answered questions about meds and elevated liver enzymes as well as her diagnosis of chronic kidney disease stage II or III and psychiatric medications.  She is not taking any psychiatric medications that should affect her kidneys.  Disc counseling.  She's been to [facility]  Explained her labs and prediabetes and kidney function.  Continues viibryd 40, Abilify 5 mg, temazepam 30 HS,Hydroxyzine 25 HS, concerta 72 mg AM  Start Wellbutrin augmentation 150 in the AM and then 300  Fu 2 mos  Meredith Staggers, MD, DFAPA    Please see After Visit Summary for patient specific instructions.  No future appointments.   No orders of the defined types were placed in this encounter.     -------------------------------

## 2023-02-04 ENCOUNTER — Telehealth: Payer: Self-pay | Admitting: Psychiatry

## 2023-02-04 DIAGNOSIS — F3342 Major depressive disorder, recurrent, in full remission: Secondary | ICD-10-CM

## 2023-02-04 DIAGNOSIS — F900 Attention-deficit hyperactivity disorder, predominantly inattentive type: Secondary | ICD-10-CM

## 2023-02-04 MED ORDER — BUPROPION HCL ER (XL) 150 MG PO TB24
ORAL_TABLET | ORAL | 0 refills | Status: DC
Start: 2023-02-04 — End: 2023-03-01

## 2023-02-04 NOTE — Telephone Encounter (Signed)
[facility] called yesterday at 9:01pm and LM requesting refill of her Bupropion XL 150mg .  Appt 10/7.  Send to [facility] [address] [place], [place] - [address]

## 2023-02-04 NOTE — Telephone Encounter (Signed)
Sent!

## 2023-02-12 NOTE — Telephone Encounter (Signed)
Error

## 2023-02-13 ENCOUNTER — Other Ambulatory Visit: Payer: Self-pay | Admitting: Psychiatry

## 2023-02-13 DIAGNOSIS — F5105 Insomnia due to other mental disorder: Secondary | ICD-10-CM

## 2023-02-28 ENCOUNTER — Other Ambulatory Visit: Payer: Self-pay | Admitting: Psychiatry

## 2023-02-28 DIAGNOSIS — F900 Attention-deficit hyperactivity disorder, predominantly inattentive type: Secondary | ICD-10-CM

## 2023-02-28 DIAGNOSIS — F3342 Major depressive disorder, recurrent, in full remission: Secondary | ICD-10-CM

## 2023-03-23 ENCOUNTER — Ambulatory Visit (INDEPENDENT_AMBULATORY_CARE_PROVIDER_SITE_OTHER): Payer: Medicare Other | Admitting: Psychiatry

## 2023-03-23 ENCOUNTER — Encounter: Payer: Self-pay | Admitting: Psychiatry

## 2023-03-23 DIAGNOSIS — F3342 Major depressive disorder, recurrent, in full remission: Secondary | ICD-10-CM | POA: Diagnosis not present

## 2023-03-23 DIAGNOSIS — F900 Attention-deficit hyperactivity disorder, predominantly inattentive type: Secondary | ICD-10-CM

## 2023-03-23 DIAGNOSIS — F411 Generalized anxiety disorder: Secondary | ICD-10-CM

## 2023-03-23 DIAGNOSIS — F5105 Insomnia due to other mental disorder: Secondary | ICD-10-CM | POA: Diagnosis not present

## 2023-03-23 MED ORDER — BUPROPION HCL ER (XL) 150 MG PO TB24
450.0000 mg | ORAL_TABLET | Freq: Every day | ORAL | 0 refills | Status: DC
Start: 2023-03-23 — End: 2023-05-25

## 2023-03-23 NOTE — Progress Notes (Signed)
Kristine Martinez [id number] 1956-04-21 67 y.o.  Subjective:   Patient ID:  Kristine Martinez is a 67 y.o. (DOB 10/15/1955) female.  Chief Complaint:  No chief complaint on file.   Kristine Martinez presents to the office today for follow-up of ADD and depression.   seen August 19, 2018.  Because of concerns about attention and focus Concerta was increased to 36 mg every morning.  The other medications were continued without change. Doesn't get tasks done.  Can't make myself do it.  Denies feeling depressed despite stressors.  Enjoys gkids and reading. Trouble staying focused.  Wants to increase Concerta.  seen March 16, 2019.  She wanted to increase the Concerta therefore was increased to 54 mg daily.  The other meds were left unchanged. Generally Good depression and anxiety response with Viibryd 40 + Abilify 5.  No SE  seen June 22, 2019.  She was doing okay with regard to mood and anxiety but did not notice sufficient benefit with the increase in Concerta to 54 mg daily.  Therefore the dosage was increased to 72 mg daily. Increase in Concerta noticed a little change but not as much as she'd like to be with productivity.  It's a little better.  GKids staying with her with son temporarily.    Patient reports stable mood and denies depressed or irritable moods.  Patient denies any recent difficulty with anxiety.  Patient denies difficulty with sleep initiation or maintenance but recent anniversary nightmares.  Overall fewer.  7-8 hours.  Otherwise Restoril helps.  Has awoken screaming.  This is unusual.  Been watching murder mysteries. Denies appetite disturbance.  Patient reports that energy and motivation have been good.    Patient denies any suicidal ideation. Plan no med changes.  04/19/20 appt with following noted: Medicare in Feb.  Picked plan and won't cover Concerta nor Viibryd.  Disc this at length.   Still doing well with the meds.  Pleased and doesn't want to change.  Holidays are hard and  doesn't want to change at holidays. Plan: BC partial response continue Concerta 72 mg daily Good depression and anxiety response with Viibryd 40 + Abilify 5.   08/14/2020 appointment with following noted: Disc insurance problems with hydroxyzine and disc reasons why they give a hassle about it.  Needs it to help sleep.  Generally sleep good with occ bad dreams. Still good mood and pleased with meds.  Able to stay on Viibryd and Concerta now that turned 67 yo. Foggy tired and can't function with out Concerta.  Helps focus and productivity. Questions about how to get refills.   3 alcoholic sons.  One got rehab and is sober.  One ruined Teacher, adult education. Active [employer] helps. Quit Pepsi and Mtn Dew. Patient reports stable mood and denies depressed or irritable moods.  Patient denies any recent difficulty with anxiety.  Patient denies difficulty with sleep initiation or maintenance. Denies appetite disturbance.  Patient reports that energy and motivation have been good.  Patient denies any difficulty with concentration.  Patient denies any suicidal ideation.  01/14/2021 appt noted: Need another insurance company.  Mail lost $500 dollars of meds and problems with [employer]. Doesn't want to change from Viibryd. Meds working for her and doesn't want change.   Disc insurance problems with hydroxyzine and disc reasons why they give a hassle about it.  Needs it to help sleep.  Patient reports stable mood and denies depressed or irritable moods.  Patient denies any recent difficulty with anxiety.  Patient denies difficulty with sleep initiation or maintenance. Denies appetite disturbance.  Patient reports that energy and motivation have been good.  Patient denies any difficulty with concentration.  Patient denies any suicidal ideation. Doing well with stimulants. No SE Repeat liver enzymes normal Plan: no med changes  05/21/21 appt noted: Continues med. Stressed bc living with 2 alcoholic sons.  Pray a lot and  stay in room a lot. Started counseling helpful twice. Continues viibryd 40, Abilify 5 mg, temazepam 30 HS,Hydroxyzine 25 HS, concerta 36 mg AM No SE. Plan: No med changes  10/08/2021 appointment with the following noted: Disc trouble getting Concerta.   Asks about alternatives.  Satisfied with meds otherwise. Patient reports stable mood and denies depressed or irritable moods.  Patient denies any recent difficulty with anxiety but stressed..  Patient denies difficulty with sleep initiation or maintenance. Denies appetite disturbance.  Patient reports that energy and motivation have been good.  Patient denies any difficulty with concentration.  Patient denies any suicidal ideation. Plan: No med changes Continues viibryd 40, Abilify 5 mg, temazepam 30 HS,Hydroxyzine 25 HS, concerta 72 mg AM  03/17/22 appt noted: All kinds of family issues causing anxiety.  Had to get a restraining order on kid whose alcoholic. Don't want to change meds.  Prayer and faith helps.  Unmotivated for 3 weeks. Sleep with door locked for fear of drunk son. Sleep good with meds.  Takes temazepam and hydroxyzine. No SE.   PCP dx DM and increased thyroid med. Plan: No med changes Continues viibryd 40, Abilify 5 mg, temazepam 30 HS,Hydroxyzine 25 HS, concerta 72 mg AM  07/21/22 app noted: Son detox twice then rehab [place] and for now better but lost license. She is having a little bit of the break.  He's acknowledging problem.  Homeless and hitting his bottom. He's at the [facility] [place].  She feels it's a very good problem. sHe continues meds. He's had SZ all alcoholic related.  She feels calm and handling it.  She's participated in family tx. Sleep is good with meds.   Concerta is about the same.  And still helpful. No SE. Plan: No med changes Continues viibryd 40, Abilify 5 mg, temazepam 30 HS,Hydroxyzine 25 HS, concerta 72 mg AM  01/20/23 appt noted:  Recent PE normal including TSH, B12, etc Complaining of  difficulty doing things bc low intereest, energy, motivation. Wonders about Wellbutrin. Son in [place] px in [place].  Plan: Start Wellbutrin augmentation 150 in the AM and then 300  03/23/23 appt noted: Wellbutrin helped with the above sx.  She would like to increase No SE with others.  Asks about definition of depression.  Again we discussed anhedonia.  She is not having severe anxiety at this time.  No panic attacks.  She is sleeping adequately with the current medications.  She is not hopeless or having significant negative thoughts.  She would like to see an improvement in energy and motivation a little further if possible by increasing the Wellbutrin  Past Psychiatric Medication Trials: Vyvanse, Concerta 72,  Ambien side effects, trazodone, prazosin, hydroxyzine, Lunesta, temazepam 30 Viibryd, duloxetine 90, Wellbutrin 450, fluoxetine,   Review of Systems:  Review of Systems  Cardiovascular:  Negative for palpitations.  Musculoskeletal:  Positive for arthralgias and gait problem.  Neurological:  Negative for dizziness and tremors.  Psychiatric/Behavioral:  Positive for decreased concentration. Negative for agitation, behavioral problems, confusion, dysphoric mood, hallucinations, self-injury, sleep disturbance and suicidal ideas. The patient is not  nervous/anxious and is not hyperactive.   Normal BP  Medications: I have reviewed the patient's current medications.  Current Outpatient Medications  Medication Sig Dispense Refill   ARIPiprazole (ABILIFY) 5 MG tablet Take 1 tablet (5 mg total) by mouth daily. 90 tablet 1   buPROPion (WELLBUTRIN XL) 150 MG 24 hr tablet TAKE 2 TABLETS BY MOUTH IN THE MORNING 60 tablet 0   Calcium Carbonate-Vitamin D (CALTRATE 600+D PO) Take by mouth.     calcium citrate-vitamin D (CITRACAL+D) 315-200 MG-UNIT tablet Take 1 tablet by mouth 2 (two) times daily.     empagliflozin (JARDIANCE) 10 MG TABS tablet Take by mouth daily.     hydrOXYzine (ATARAX)  25 MG tablet TAKE 1 TABLET(25 MG) BY MOUTH AT BEDTIME AS NEEDED 90 tablet 2   hydrOXYzine (VISTARIL) 25 MG capsule TAKE 1 CAPSULE(25 MG) BY MOUTH AT BEDTIME 90 capsule 1   levothyroxine (SYNTHROID) 100 MCG tablet Take 1 tablet (100 mcg total) by mouth daily before breakfast. 60 tablet 1   methylphenidate (CONCERTA) 36 MG PO CR tablet Take 2 tablets (72 mg total) by mouth daily. 180 tablet 0   methylphenidate 36 MG PO CR tablet methylphenidate ER 36 mg tablet,extended release 24 hr     omeprazole (PRILOSEC OTC) 20 MG tablet Take 20 mg by mouth daily.     pantoprazole (PROTONIX) 40 MG tablet Take 1 tablet (40 mg total) by mouth daily. 90 tablet 1   temazepam (RESTORIL) 30 MG capsule TAKE 1 CAPSULE(30 MG) BY MOUTH AT BEDTIME 90 capsule 0   thyroid (ARMOUR) 60 MG tablet      Vilazodone HCl (VIIBRYD) 40 MG TABS Take 1 tablet (40 mg total) by mouth daily. 90 tablet 1   No current facility-administered medications for this visit.    Medication Side Effects: None  Allergies: No Known Allergies  Past Medical History:  Diagnosis Date   Allergic rhinitis    Attention deficit disorder (ADD) without hyperactivity    Chronic kidney disease (CKD), active medical management without dialysis, stage 3 (moderate) (HCC)    Depression    Gastro-esophageal reflux disease without esophagitis    High cholesterol    Hypothyroidism    Migraine with aura    NASH (nonalcoholic steatohepatitis)    OA (osteoarthritis)    Obesity    Osteopenia    Prediabetes    Sacroiliitis (HCC)    Sinusitis    Vitamin B12 deficiency    Vitamin D deficiency     Family History  Problem Relation Age of Onset   Alzheimer's disease Maternal Aunt     Social History   Socioeconomic History   Marital status: Widowed    Spouse name: Not on file   Number of children: Not on file   Years of education: Not on file   Highest education level: Not on file  Occupational History   Not on file  Tobacco Use   Smoking status:  Never   Smokeless tobacco: Never  Substance and Sexual Activity   Alcohol use: Not on file   Drug use: Not on file   Sexual activity: Not on file  Other Topics Concern   Not on file  Social History Narrative   Not on file   Social Determinants of Health   Financial Resource Strain: Not on file  Food Insecurity: Not on file  Transportation Needs: Not on file  Physical Activity: Not on file  Stress: Not on file  Social Connections: Unknown (10/26/2021)   Received  [facility]   Social Network    Social Network: Not on file  Intimate Partner Violence: Unknown (09/17/2021)   Received from [facility]   HITS    Physically Hurt: Not on file    Insult or Talk Down To: Not on file    Threaten Physical Harm: Not on file    Scream or Curse: Not on file    Past Medical History, Surgical history, Social history, and Family history were reviewed and updated as appropriate.   Please see review of systems for further details on the patient's review from today.   Objective:   Physical Exam:  There were no vitals taken for this visit.  Physical Exam Constitutional:      General: She is not in acute distress.    Appearance: She is well-developed. She is obese.  Musculoskeletal:        General: No deformity.  Neurological:     Mental Status: She is alert and oriented to person, place, and time.     Motor: No tremor.     Coordination: Coordination normal.     Gait: Gait normal.  Psychiatric:        Attention and Perception: She is attentive.        Mood and Affect: Mood is anxious and depressed. Affect is not labile, blunt, angry or tearful.        Speech: Speech normal.        Behavior: Behavior normal. Behavior is not slowed.        Thought Content: Thought content normal. Thought content is not delusional. Thought content does not include homicidal or suicidal ideation.        Cognition and Memory: Cognition normal.        Judgment: Judgment  normal.     Comments: Insight is good. No AIM    Lab Review:  No results found for: "NA", "K", "CL", "CO2", "GLUCOSE", "BUN", "CREATININE", "CALCIUM", "PROT", "ALBUMIN", "AST", "ALT", "ALKPHOS", "BILITOT", "GFRNONAA", "GFRAA"     Component Value Date/Time   WBC 6.2 01/16/2014 0843   RBC 4.74 01/16/2014 0843   HGB 13.2 01/16/2014 0843   HCT 38.9 01/16/2014 0843   PLT 212 01/16/2014 0843   MCV 82.1 01/16/2014 0843   MCH 27.8 01/16/2014 0843   MCHC 33.9 01/16/2014 0843   RDW 15.5 01/16/2014 0843    No results found for: "POCLITH", "LITHIUM"   No results found for: "PHENYTOIN", "PHENOBARB", "VALPROATE", "CBMZ"   .res Assessment: Plan:    No diagnosis found.    30 min face to face time with patient was spent on counseling and coordination of care. We discussed Educated difference between anhedonic depression with poor attention and ADHD and overlap of sx in detail.  She doesn't believe she's depressed.  But she does have anhedonia which is fortunately improved with Wellbutrin added.  She is tolerating well.  Disc drug pricing for generics and GoodRx.  Extensived Disc differnence between insurance plans and coverage of meds. Meds are unique and there aren't cheap alternatives.  continue Concerta 72 mg daily. Tolerating last increase. BP and pulse were OK.   Disc the shortage and she asks about alternatives but not a good generic long acting option. Can't mange TID dosing of stimulants. Discussed potential benefits, risks, and side effects of stimulants with patient to include increased heart rate, palpitations, insomnia, increased anxiety, increased irritability, or decreased appetite.  Instructed patient to contact office if experiencing any significant tolerability issues.  Good depression and anxiety response with Viibryd 40 + Abilify 5.  No SE  Tendency towards anxiety much better with this med combination..  Discussed potential metabolic side effects associated with atypical  antipsychotics, as well as potential risk for movement side effects. Advised pt to contact office if movement side effects occur.   Good response with sleep meds still. Sleep good overall with occ NM.  Not enough to restart NM meds. Hydroxyzine helps but still needs temazepam. Disc normal sleep stages.     Patient has a history of elevated liver enzymes which have returned to normal lately.  She has not had significant weight loss.  Answered questions about meds and elevated liver enzymes as well as her diagnosis of chronic kidney disease stage II or III and psychiatric medications.  She is not taking any psychiatric medications that should affect her kidneys.  Disc counseling.  She's been to [facility]  Explained her labs and prediabetes and kidney function.  Continues viibryd 40, Abilify 5 mg, temazepam 30 HS,Hydroxyzine 25 HS, concerta 72 mg AM  Ok increase, Wellbutrin augmentation 450 in the AM  Disc differences in types and SE.  Fu 2 mos  Meredith Staggers, MD, DFAPA    Please see After Visit Summary for patient specific instructions.  No future appointments.   No orders of the defined types were placed in this encounter.     -------------------------------

## 2023-03-31 ENCOUNTER — Telehealth: Payer: Self-pay | Admitting: Psychiatry

## 2023-03-31 NOTE — Telephone Encounter (Signed)
Pt called asking for a refill on her concerta 36 mg . Pharmacy is [facility] on [address]. She needs a 90 supply

## 2023-04-07 DIAGNOSIS — K080 Exfoliation of teeth due to systemic causes: Secondary | ICD-10-CM | POA: Diagnosis not present

## 2023-05-06 ENCOUNTER — Telehealth: Payer: Self-pay | Admitting: Psychiatry

## 2023-05-06 NOTE — Telephone Encounter (Signed)
Kristine Martinez called and LM 11/19 at 8:32pm to request refill of her Temazepam 30mg .  Appt 05/25/23.  Send to   [id number] - [place], [place] - [address] AT [facility] [address]

## 2023-05-07 ENCOUNTER — Other Ambulatory Visit: Payer: Self-pay

## 2023-05-07 DIAGNOSIS — F5105 Insomnia due to other mental disorder: Secondary | ICD-10-CM

## 2023-05-07 MED ORDER — TEMAZEPAM 30 MG PO CAPS
ORAL_CAPSULE | ORAL | 0 refills | Status: DC
Start: 2023-05-07 — End: 2023-05-08

## 2023-05-07 NOTE — Telephone Encounter (Signed)
Sent 30 days dt pt having an appt 12/9

## 2023-05-07 NOTE — Telephone Encounter (Signed)
Pt called again on 11/21 @ 9:31a requesting the same.

## 2023-05-07 NOTE — Telephone Encounter (Signed)
She said she wanted a 90 day script.

## 2023-05-08 ENCOUNTER — Other Ambulatory Visit: Payer: Self-pay | Admitting: Psychiatry

## 2023-05-08 ENCOUNTER — Other Ambulatory Visit: Payer: Self-pay

## 2023-05-08 DIAGNOSIS — F5105 Insomnia due to other mental disorder: Secondary | ICD-10-CM

## 2023-05-08 MED ORDER — TEMAZEPAM 30 MG PO CAPS
ORAL_CAPSULE | ORAL | 0 refills | Status: DC
Start: 2023-05-08 — End: 2023-05-08

## 2023-05-08 MED ORDER — TEMAZEPAM 30 MG PO CAPS
ORAL_CAPSULE | ORAL | 1 refills | Status: DC
Start: 2023-05-08 — End: 2023-05-25

## 2023-05-11 DIAGNOSIS — Z6836 Body mass index (BMI) 36.0-36.9, adult: Secondary | ICD-10-CM | POA: Diagnosis not present

## 2023-05-11 DIAGNOSIS — R251 Tremor, unspecified: Secondary | ICD-10-CM | POA: Diagnosis not present

## 2023-05-12 ENCOUNTER — Encounter: Payer: Self-pay | Admitting: Neurology

## 2023-05-12 ENCOUNTER — Other Ambulatory Visit: Payer: Self-pay | Admitting: Psychiatry

## 2023-05-12 DIAGNOSIS — F3342 Major depressive disorder, recurrent, in full remission: Secondary | ICD-10-CM

## 2023-05-24 NOTE — Progress Notes (Unsigned)
Assessment/Plan:   Tremor, by hx  -suspect medication induced  -no tremor on examination today  -better with reduction in wellbutrin  -concerta could certainly play a role  -abilify can induce tremor but usually rest tremor and I don't see this today and do not see parkinsonism on examination  -discussed medication but didn't recommend as I think risk:benefit ratio isn't in her favor.  She didn't disagree and she didn't want medication  -reassurance provided that no evidence of a neurodeg process like Parkinsons Disease  -f/u prn  Subjective:   Kristine Martinez was seen today in the movement disorders clinic for neurologic consultation at the request of Daisy Floro, MD.  The consultation is for the evaluation of tremor.  She also follows with Dr. Jennelle Human and his notes are reviewed.   Presents today for further evaluation of tremor.     Specific Symptoms:  Tremor: Yes.   X 1 year - was made worse by increase in wellbutrin dosage a few months ago (150 to 300 mg) and that was decreased since.  Its mostly the L hand.  It can be rest or activation.  Doesn't drink caffeine/alcohol.  Stress doesn't change the tremor Family hx of similar:  No. Voice: no change Sleep: was sleeping well but takes hydroxyzine and temazepam; recently frequent wake up  Vivid Dreams:  some  Acting out dreams:  No., only with ambien years ago Wet Pillows: No. Postural symptoms:  she is very careful b/c of bad knees  Falls?  None in years Bradykinesia symptoms: difficulty getting out of a chair (b/c of knees); slower with walking (b/c of knees); no shuffling Loss of smell:  No. Loss of taste:  No. Urinary Incontinence:  No. Difficulty Swallowing:  No. Handwriting, micrographia: No. Trouble with ADL's:  No.  Trouble buttoning clothing: No. N/V:  No. Lightheaded: only if gets up too quickly  Syncope: No. Diplopia:  No. Prior exposure to reglan/antipsychotics: Yes.  , on abilify since 2019 Other tremor  inducing meds:  concerta (unknown how long have been on it)  Neuroimaging of the brain has not previously been performed.      ALLERGIES:  No Known Allergies  CURRENT MEDICATIONS:  Current Meds  Medication Sig   ARIPiprazole (ABILIFY) 5 MG tablet Take 1 tablet (5 mg total) by mouth daily.   buPROPion (WELLBUTRIN XL) 300 MG 24 hr tablet Take 1 tablet (300 mg total) by mouth daily. (Patient taking differently: Take 150 mg by mouth daily. Take 1 daily)   Calcium Carbonate-Vitamin D (CALTRATE 600+D PO) Take by mouth.   calcium citrate-vitamin D (CITRACAL+D) 315-200 MG-UNIT tablet Take 1 tablet by mouth 2 (two) times daily.   empagliflozin (JARDIANCE) 10 MG TABS tablet Take 25 mg by mouth daily.   hydrOXYzine (ATARAX) 25 MG tablet TAKE 1 TABLET(25 MG) BY MOUTH AT BEDTIME AS NEEDED   levothyroxine (SYNTHROID) 100 MCG tablet Take 1 tablet (100 mcg total) by mouth daily before breakfast.   methylphenidate (CONCERTA) 36 MG PO CR tablet Take 2 tablets (72 mg total) by mouth daily.   methylphenidate 36 MG PO CR tablet methylphenidate ER 36 mg tablet,extended release 24 hr   omeprazole (PRILOSEC OTC) 20 MG tablet Take 20 mg by mouth daily.   pantoprazole (PROTONIX) 40 MG tablet Take 1 tablet (40 mg total) by mouth daily.   temazepam (RESTORIL) 30 MG capsule TAKE 1 CAPSULE(30 MG) BY MOUTH AT BEDTIME   thyroid (ARMOUR) 60 MG tablet  Vilazodone HCl (VIIBRYD) 40 MG TABS Take 1 tablet (40 mg total) by mouth daily.     Objective:   VITALS:   Vitals:   05/26/23 0957  Pulse: 92  SpO2: 97%  Weight: 210 lb (95.3 kg)  Height: 5\' 3"  (1.6 m)    GEN:  The patient appears stated age and is in NAD. HEENT:  Normocephalic, atraumatic.  The mucous membranes are moist. The superficial temporal arteries are without ropiness or tenderness. CV:  RRR Lungs:  CTAB Neck/HEME:  There are no carotid bruits bilaterally.  Neurological examination:  Orientation: The patient is alert and oriented x3.  Cranial  nerves: There is good facial symmetry. Extraocular muscles are intact. The visual fields are full to confrontational testing. The speech is fluent and clear. Soft palate rises symmetrically and there is no tongue deviation. Hearing is intact to conversational tone. Sensation: Sensation is intact to light and pinprick throughout (facial, trunk, extremities). Vibration is intact at the bilateral big toe. There is no extinction with double simultaneous stimulation. There is no sensory dermatomal level identified. Motor: Strength is 5/5 in the bilateral upper and lower extremities.   Shoulder shrug is equal and symmetric.  There is no pronator drift. Deep tendon reflexes: Deep tendon reflexes are 2/4 at the bilateral biceps, triceps, brachioradialis, patella, 2+ at the right patella and 1/4 at the bilateral achilles. Plantar responses are downgoing bilaterally.  Movement examination: Tone: There is nl tone in the bilateral upper extremities.  The tone in the lower extremities is nl.  Abnormal movements: No rest tremor is noted.  No significant postural or intention tremor.  No tremor when given a weight.  Archimedes spirals are drawn well.  She pours water from 1 glass to another without significant spillage (spills a little, but that was because she was holding the Styrofoam cup too tight).     Coordination:  There is no decremation with RAM's, with any form of RAMS, including alternating supination and pronation of the forearm, hand opening and closing, finger taps, heel taps and toe taps.  Gait and Station: The patient has no difficulty arising out of a deep-seated chair without the use of the hands. The patient's stride length is good.    Total time spent on today's visit was 45 minutes, including both face-to-face time and nonface-to-face time.  Time included that spent on review of records (prior notes available to me/labs/imaging if pertinent), discussing treatment and goals, answering patient's  questions and coordinating care.  Cc:  Daisy Floro, MD

## 2023-05-25 ENCOUNTER — Encounter: Payer: Self-pay | Admitting: Psychiatry

## 2023-05-25 ENCOUNTER — Ambulatory Visit: Payer: Medicare Other | Admitting: Psychiatry

## 2023-05-25 DIAGNOSIS — F3342 Major depressive disorder, recurrent, in full remission: Secondary | ICD-10-CM

## 2023-05-25 DIAGNOSIS — F5105 Insomnia due to other mental disorder: Secondary | ICD-10-CM | POA: Diagnosis not present

## 2023-05-25 DIAGNOSIS — F900 Attention-deficit hyperactivity disorder, predominantly inattentive type: Secondary | ICD-10-CM | POA: Diagnosis not present

## 2023-05-25 DIAGNOSIS — F411 Generalized anxiety disorder: Secondary | ICD-10-CM

## 2023-05-25 MED ORDER — METHYLPHENIDATE HCL ER (OSM) 36 MG PO TBCR: 72.0000 mg | EXTENDED_RELEASE_TABLET | Freq: Every day | ORAL | 0 refills | Status: DC

## 2023-05-25 MED ORDER — VILAZODONE HCL 40 MG PO TABS: 40.0000 mg | ORAL_TABLET | Freq: Every day | ORAL | 1 refills | Status: DC

## 2023-05-25 MED ORDER — BUPROPION HCL ER (XL) 300 MG PO TB24: 300.0000 mg | ORAL_TABLET | Freq: Every day | ORAL | 0 refills | Status: DC

## 2023-05-25 MED ORDER — TEMAZEPAM 30 MG PO CAPS: ORAL_CAPSULE | ORAL | 0 refills | Status: DC

## 2023-05-25 MED ORDER — ARIPIPRAZOLE 5 MG PO TABS: 5.0000 mg | ORAL_TABLET | Freq: Every day | ORAL | 0 refills | Status: DC

## 2023-05-25 NOTE — Progress Notes (Signed)
Kristine Martinez [id number] [birth year] 67 y.o.  Subjective:   Patient ID:  Kristine Martinez is a 67 y.o. (DOB 1955-09-27) female.  Chief Complaint:  Chief Complaint  Patient presents with   Follow-up   Depression   Drug Problem   Medication Reaction    DUSKA BRACKENRIDGE presents to the office today for follow-up of ADD and depression.   seen August 19, 2018.  Because of concerns about attention and focus Concerta was increased to 36 mg every morning.  The other medications were continued without change. Doesn't get tasks done.  Can't make myself do it.  Denies feeling depressed despite stressors.  Enjoys gkids and reading. Trouble staying focused.  Wants to increase Concerta.  seen March 16, 2019.  She wanted to increase the Concerta therefore was increased to 54 mg daily.  The other meds were left unchanged. Generally Good depression and anxiety response with Viibryd 40 + Abilify 5.  No SE  seen June 22, 2019.  She was doing okay with regard to mood and anxiety but did not notice sufficient benefit with the increase in Concerta to 54 mg daily.  Therefore the dosage was increased to 72 mg daily. Increase in Concerta noticed a little change but not as much as she'd like to be with productivity.  It's a little better.  GKids staying with her with son temporarily.    Patient reports stable mood and denies depressed or irritable moods.  Patient denies any recent difficulty with anxiety.  Patient denies difficulty with sleep initiation or maintenance but recent anniversary nightmares.  Overall fewer.  7-8 hours.  Otherwise Restoril helps.  Has awoken screaming.  This is unusual.  Been watching murder mysteries. Denies appetite disturbance.  Patient reports that energy and motivation have been good.    Patient denies any suicidal ideation. Plan no med changes.  04/19/20 appt with following noted: Medicare in Feb.  Picked plan and won't cover Concerta nor Viibryd.  Disc this at length.   Still doing well  with the meds.  Pleased and doesn't want to change.  Holidays are hard and doesn't want to change at holidays. Plan: BC partial response continue Concerta 72 mg daily Good depression and anxiety response with Viibryd 40 + Abilify 5.   08/14/2020 appointment with following noted: Disc insurance problems with hydroxyzine and disc reasons why they give a hassle about it.  Needs it to help sleep.  Generally sleep good with occ bad dreams. Still good mood and pleased with meds.  Able to stay on Viibryd and Concerta now that turned 67 yo. Foggy tired and can't function with out Concerta.  Helps focus and productivity. Questions about how to get refills.   3 alcoholic sons.  One got rehab and is sober.  One ruined Teacher, adult education. Active [employer] helps. Quit Pepsi and Mtn Dew. Patient reports stable mood and denies depressed or irritable moods.  Patient denies any recent difficulty with anxiety.  Patient denies difficulty with sleep initiation or maintenance. Denies appetite disturbance.  Patient reports that energy and motivation have been good.  Patient denies any difficulty with concentration.  Patient denies any suicidal ideation.  01/14/2021 appt noted: Need another insurance company.  Mail lost $500 dollars of meds and problems with [employer]. Doesn't want to change from Viibryd. Meds working for her and doesn't want change.   Disc insurance problems with hydroxyzine and disc reasons why they give a hassle about it.  Needs it to help sleep.  Patient reports  stable mood and denies depressed or irritable moods.  Patient denies any recent difficulty with anxiety.  Patient denies difficulty with sleep initiation or maintenance. Denies appetite disturbance.  Patient reports that energy and motivation have been good.  Patient denies any difficulty with concentration.  Patient denies any suicidal ideation. Doing well with stimulants. No SE Repeat liver enzymes normal Plan: no med changes  05/21/21 appt  noted: Continues med. Stressed bc living with 2 alcoholic sons.  Pray a lot and stay in room a lot. Started counseling helpful twice. Continues viibryd 40, Abilify 5 mg, temazepam 30 HS,Hydroxyzine 25 HS, concerta 36 mg AM No SE. Plan: No med changes  10/08/2021 appointment with the following noted: Disc trouble getting Concerta.   Asks about alternatives.  Satisfied with meds otherwise. Patient reports stable mood and denies depressed or irritable moods.  Patient denies any recent difficulty with anxiety but stressed..  Patient denies difficulty with sleep initiation or maintenance. Denies appetite disturbance.  Patient reports that energy and motivation have been good.  Patient denies any difficulty with concentration.  Patient denies any suicidal ideation. Plan: No med changes Continues viibryd 40, Abilify 5 mg, temazepam 30 HS,Hydroxyzine 25 HS, concerta 72 mg AM  03/17/22 appt noted: All kinds of family issues causing anxiety.  Had to get a restraining order on kid whose alcoholic. Don't want to change meds.  Prayer and faith helps.  Unmotivated for 3 weeks. Sleep with door locked for fear of drunk son. Sleep good with meds.  Takes temazepam and hydroxyzine. No SE.   PCP dx DM and increased thyroid med. Plan: No med changes Continues viibryd 40, Abilify 5 mg, temazepam 30 HS,Hydroxyzine 25 HS, concerta 72 mg AM  07/21/22 app noted: Son detox twice then rehab [place] and for now better but lost license. She is having a little bit of the break.  He's acknowledging problem.  Homeless and hitting his bottom. He's at the [facility] [place].  She feels it's a very good problem. sHe continues meds. He's had SZ all alcoholic related.  She feels calm and handling it.  She's participated in family tx. Sleep is good with meds.   Concerta is about the same.  And still helpful. No SE. Plan: No med changes Continues viibryd 40, Abilify 5 mg, temazepam 30 HS,Hydroxyzine 25 HS, concerta 72 mg  AM  01/20/23 appt noted:  Recent PE normal including TSH, B12, etc Complaining of difficulty doing things bc low intereest, energy, motivation. Wonders about Wellbutrin. Son in [place] drinking px in [place].  Plan: Start Wellbutrin augmentation 150 in the AM and then 300  03/23/23 appt noted: Wellbutrin helped with the above sx.  She would like to increase No SE with others.  Asks about definition of depression.  Again we discussed anhedonia.  She is not having severe anxiety at this time.  No panic attacks.  She is sleeping adequately with the current medications.  She is not hopeless or having significant negative thoughts.  She would like to see an improvement in energy and motivation a little further if possible by increasing the Wellbutrin Plan: Continues viibryd 40, Abilify 5 mg, temazepam 30 HS, Hydroxyzine 25 HS, concerta 72 mg AM Ok increase, Wellbutrin augmentation 450 in the AM   05/25/23 appt noted: Xmas not her favorite bc anniversary of H's death. Concerned about tremor which started about a year ago with greadual worsening.   Reduced it to 300 mg daily.  It is better with reduction.  Appt with Dr. Arbutus Leas tomorrow.  Her main concern is Parkinson's.   She still feels benefit of Wellbutrin for energy and focus and productivity.  Still not as productive as she would like. Mood and anxiety "fine" overall.   D in law decided she was gay and leaving her son and the family kids: 51, 68, 79 yo.  Son is handling it ok.  Another son alcoholic. Overall handles things well.  EFA hourly briefly.  Past Psychiatric Medication Trials: Vyvanse, Concerta 72,   Ambien side effects, trazodone, prazosin, hydroxyzine, Lunesta, temazepam 30  Viibryd, duloxetine 90, Wellbutrin 450 worsened tremor, fluoxetine,   Review of Systems:  Review of Systems  Cardiovascular:  Negative for palpitations.  Musculoskeletal:  Positive for arthralgias and gait problem.  Neurological:  Negative for dizziness and  tremors.  Psychiatric/Behavioral:  Positive for decreased concentration. Negative for agitation, behavioral problems, confusion, dysphoric mood, hallucinations, self-injury, sleep disturbance and suicidal ideas. The patient is not nervous/anxious and is not hyperactive.   Normal BP  Medications: I have reviewed the patient's current medications.  Current Outpatient Medications  Medication Sig Dispense Refill   ARIPiprazole (ABILIFY) 5 MG tablet Take 1 tablet (5 mg total) by mouth daily. 90 tablet 0   buPROPion (WELLBUTRIN XL) 300 MG 24 hr tablet Take 1 tablet (300 mg total) by mouth daily. 90 tablet 0   Calcium Carbonate-Vitamin D (CALTRATE 600+D PO) Take by mouth.     calcium citrate-vitamin D (CITRACAL+D) 315-200 MG-UNIT tablet Take 1 tablet by mouth 2 (two) times daily.     empagliflozin (JARDIANCE) 10 MG TABS tablet Take by mouth daily.     hydrOXYzine (ATARAX) 25 MG tablet TAKE 1 TABLET(25 MG) BY MOUTH AT BEDTIME AS NEEDED 90 tablet 2   levothyroxine (SYNTHROID) 100 MCG tablet Take 1 tablet (100 mcg total) by mouth daily before breakfast. 60 tablet 1   methylphenidate (CONCERTA) 36 MG PO CR tablet Take 2 tablets (72 mg total) by mouth daily. 180 tablet 0   methylphenidate 36 MG PO CR tablet methylphenidate ER 36 mg tablet,extended release 24 hr     omeprazole (PRILOSEC OTC) 20 MG tablet Take 20 mg by mouth daily.     pantoprazole (PROTONIX) 40 MG tablet Take 1 tablet (40 mg total) by mouth daily. 90 tablet 1   temazepam (RESTORIL) 30 MG capsule TAKE 1 CAPSULE(30 MG) BY MOUTH AT BEDTIME 90 capsule 0   thyroid (ARMOUR) 60 MG tablet      Vilazodone HCl (VIIBRYD) 40 MG TABS Take 1 tablet (40 mg total) by mouth daily. 90 tablet 1   No current facility-administered medications for this visit.    Medication Side Effects: None  Allergies: No Known Allergies  Past Medical History:  Diagnosis Date   Allergic rhinitis    Attention deficit disorder (ADD) without hyperactivity    Chronic  kidney disease (CKD), active medical management without dialysis, stage 3 (moderate) (HCC)    Depression    Gastro-esophageal reflux disease without esophagitis    High cholesterol    Hypothyroidism    Migraine with aura    NASH (nonalcoholic steatohepatitis)    OA (osteoarthritis)    Obesity    Osteopenia    Prediabetes    Sacroiliitis (HCC)    Sinusitis    Vitamin B12 deficiency    Vitamin D deficiency     Family History  Problem Relation Age of Onset   Alzheimer's disease Maternal Aunt     Social History   Socioeconomic History  Marital status: Widowed    Spouse name: Not on file   Number of children: Not on file   Years of education: Not on file   Highest education level: Not on file  Occupational History   Not on file  Tobacco Use   Smoking status: Never   Smokeless tobacco: Never  Substance and Sexual Activity   Alcohol use: Not on file   Drug use: Not on file   Sexual activity: Not on file  Other Topics Concern   Not on file  Social History Narrative   Not on file   Social Determinants of Health   Financial Resource Strain: Not on file  Food Insecurity: Not on file  Transportation Needs: Not on file  Physical Activity: Not on file  Stress: Not on file  Social Connections: Unknown (10/26/2021)   [facility]   Social Network    Social Network: Not on file  Intimate Partner Violence: Unknown (09/17/2021)   [facility]   HITS    Physically Hurt: Not on file    Insult or Talk Down To: Not on file    Threaten Physical Harm: Not on file    Scream or Curse: Not on file    Past Medical History, Surgical history, Social history, and Family history were reviewed and updated as appropriate.   Please see review of systems for further details on the patient's review from today.   Objective:   Physical Exam:  There were no vitals taken for this visit.  Physical Exam Constitutional:       General: She is not in acute distress.    Appearance: She is well-developed. She is obese.  Musculoskeletal:        General: No deformity.  Neurological:     Mental Status: She is alert and oriented to person, place, and time.     Motor: No tremor.     Coordination: Coordination normal.     Gait: Gait normal.  Psychiatric:        Attention and Perception: She is attentive.        Mood and Affect: Mood is anxious and depressed. Affect is not labile, blunt, angry or tearful.        Speech: Speech normal.        Behavior: Behavior normal. Behavior is not slowed.        Thought Content: Thought content normal. Thought content is not delusional. Thought content does not include homicidal or suicidal ideation.        Cognition and Memory: Cognition normal.        Judgment: Judgment normal.     Comments: Insight is good. No AIM     Lab Review:  No results found for: "NA", "K", "CL", "CO2", "GLUCOSE", "BUN", "CREATININE", "CALCIUM", "PROT", "ALBUMIN", "AST", "ALT", "ALKPHOS", "BILITOT", "GFRNONAA", "GFRAA"     Component Value Date/Time   WBC 6.2 01/16/2014 0843   RBC 4.74 01/16/2014 0843   HGB 13.2 01/16/2014 0843   HCT 38.9 01/16/2014 0843   PLT 212 01/16/2014 0843   MCV 82.1 01/16/2014 0843   MCH 27.8 01/16/2014 0843   MCHC 33.9 01/16/2014 0843   RDW 15.5 01/16/2014 0843    No results found for: "POCLITH", "LITHIUM"   No results found for: "PHENYTOIN", "PHENOBARB", "VALPROATE", "CBMZ"   .res Assessment: Plan:    Generalized anxiety disorder  Attention deficit hyperactivity disorder (ADHD), predominantly inattentive type - Plan: buPROPion (WELLBUTRIN XL) 300 MG 24  hr tablet, methylphenidate (CONCERTA) 36 MG PO CR tablet  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 We discussed Educated difference between anhedonic depression with poor attention and ADHD and overlap of sx in detail.  She doesn't believe she's depressed.  But she does have anhedonia which is fortunately improved with Wellbutrin added.  She is tolerating well.  Disc drug pricing for generics and GoodRx.  Extensived Disc differnence between insurance plans and coverage of meds. Meds are unique and there aren't cheap alternatives.  continue Concerta 72 mg daily. Tolerating last increase. BP and pulse were OK.   Disc the shortage and she asks about alternatives but not a good generic long acting option. Can't mange TID dosing of stimulants. Discussed potential benefits, risks, and side effects of stimulants with patient to include increased heart rate, palpitations, insomnia, increased anxiety, increased irritability, or decreased appetite.  Instructed patient to contact office if experiencing any significant tolerability issues.  Good depression and anxiety response with Viibryd 40 + Abilify 5.  No SE  Tendency towards anxiety much better with this med combination..  Discussed potential metabolic side effects associated with atypical antipsychotics, as well as potential risk for movement side effects. Advised pt to contact office if movement side effects occur.   Good response with sleep meds still. Sleep good overall with occ NM.  Not enough to restart NM meds. Hydroxyzine helps but still needs temazepam. Disc normal sleep stages.     Patient has a history of elevated liver enzymes which have returned to normal lately.  She has not had significant weight loss.  Answered questions about meds and elevated liver enzymes as well as her diagnosis of chronic kidney disease stage II or III and psychiatric medications.  She is not taking any psychiatric medications that should affect her kidneys.  Disc  counseling.  She's been to [facility]  Explained her labs and prediabetes and kidney function.  Continues viibryd 40, Abilify 5 mg, temazepam 30 HS,Hydroxyzine 25 HS, concerta 72 mg AM  Reduce Wellbutrin augmentation 300 in the AM .  BC tremor worse with increase. Disc differences in types and SE.  Fu 2 mos  Meredith Staggers, MD, DFAPA    Please see After Visit Summary for patient specific instructions.  Future Appointments  Date Time Provider Department Center  05/26/2023  9:45 AM Tat, Octaviano Batty, DO [facility] None     No orders of the defined types were placed in this encounter.     -------------------------------

## 2023-05-26 ENCOUNTER — Encounter: Payer: Self-pay | Admitting: Neurology

## 2023-05-26 ENCOUNTER — Ambulatory Visit: Payer: Medicare Other | Admitting: Neurology

## 2023-05-26 VITALS — BP 124/72 | HR 92 | Ht 63.0 in | Wt 210.0 lb

## 2023-05-26 DIAGNOSIS — G251 Drug-induced tremor: Secondary | ICD-10-CM | POA: Diagnosis not present

## 2023-05-26 NOTE — Patient Instructions (Signed)
Good to see you today!  We discussed that you don't have Parkinsons Disease and tremor is likely from medication, although I didn't see a lot today.  I didn't recommend medication for that today, unless things get worse.  Happy Holidays!  The physicians and staff at [facility] are committed to providing excellent care. You may receive a survey requesting feedback about your experience at our office. We strive to receive "very good" responses to the survey questions. If you feel that your experience would prevent you from giving the office a "very good " response, please contact our office to try to remedy the situation. We may be reached at [phone]. Thank you for taking the time out of your busy day to complete the survey.

## 2023-05-30 ENCOUNTER — Other Ambulatory Visit: Payer: Self-pay | Admitting: Psychiatry

## 2023-06-11 ENCOUNTER — Other Ambulatory Visit: Payer: Self-pay | Admitting: Psychiatry

## 2023-06-11 DIAGNOSIS — F5105 Insomnia due to other mental disorder: Secondary | ICD-10-CM

## 2023-06-26 ENCOUNTER — Other Ambulatory Visit: Payer: Self-pay | Admitting: Psychiatry

## 2023-06-26 DIAGNOSIS — F3342 Major depressive disorder, recurrent, in full remission: Secondary | ICD-10-CM

## 2023-06-30 ENCOUNTER — Telehealth: Payer: Self-pay | Admitting: Psychiatry

## 2023-06-30 DIAGNOSIS — F3342 Major depressive disorder, recurrent, in full remission: Secondary | ICD-10-CM

## 2023-06-30 MED ORDER — VILAZODONE HCL 40 MG PO TABS: 40.0000 mg | ORAL_TABLET | Freq: Every day | ORAL | 0 refills | Status: DC

## 2023-06-30 NOTE — Telephone Encounter (Signed)
 Patient has RF of both medications at different pharmacies. Will cancel as appropriate and pend to the respective pharmacies.

## 2023-06-30 NOTE — Telephone Encounter (Signed)
 Concerta due 1/17. Rx for Viibryd sent to [facility] on [address].

## 2023-06-30 NOTE — Telephone Encounter (Signed)
 Kristine Martinez called at 1:10 to request refill of her Concerta  extended release 36mg .  Requested #180.  Send to [id number] - [place], [place] - [address] AT [facility] OF [address]   She also requested a refill of her Viibryd  40mg , #90.  Send to [facility] [address], [place] [address]   Next appt 11/23/23

## 2023-07-03 ENCOUNTER — Other Ambulatory Visit: Payer: Self-pay

## 2023-07-03 DIAGNOSIS — F900 Attention-deficit hyperactivity disorder, predominantly inattentive type: Secondary | ICD-10-CM

## 2023-07-03 MED ORDER — METHYLPHENIDATE HCL ER (OSM) 36 MG PO TBCR: 72.0000 mg | EXTENDED_RELEASE_TABLET | Freq: Every day | ORAL | 0 refills | Status: DC

## 2023-07-03 NOTE — Telephone Encounter (Signed)
Pended 90-day RF of Concerta 36 mg x2 to [facility] on [address].

## 2023-07-06 ENCOUNTER — Telehealth: Payer: Self-pay | Admitting: Psychiatry

## 2023-07-06 NOTE — Telephone Encounter (Signed)
Patient called stating that she needs a PA for Methylphenidate 36mg . PH: [phone] Appt 6/9

## 2023-07-06 NOTE — Telephone Encounter (Signed)
PA initiated in CMM.

## 2023-07-10 NOTE — Telephone Encounter (Signed)
Pt lvm today asking the status of the PA. Please give her a call at [phone]

## 2023-07-14 ENCOUNTER — Other Ambulatory Visit: Payer: Self-pay

## 2023-07-14 DIAGNOSIS — R7303 Prediabetes: Secondary | ICD-10-CM | POA: Diagnosis not present

## 2023-07-14 DIAGNOSIS — F900 Attention-deficit hyperactivity disorder, predominantly inattentive type: Secondary | ICD-10-CM

## 2023-07-14 MED ORDER — METHYLPHENIDATE HCL ER 36 MG PO TB24: 72.0000 mg | ORAL_TABLET | Freq: Every day | ORAL | 0 refills | Status: AC

## 2023-08-17 ENCOUNTER — Other Ambulatory Visit: Payer: Self-pay | Admitting: Psychiatry

## 2023-08-17 DIAGNOSIS — F3342 Major depressive disorder, recurrent, in full remission: Secondary | ICD-10-CM

## 2023-08-17 DIAGNOSIS — F900 Attention-deficit hyperactivity disorder, predominantly inattentive type: Secondary | ICD-10-CM

## 2023-09-07 ENCOUNTER — Other Ambulatory Visit: Payer: Self-pay

## 2023-09-07 ENCOUNTER — Telehealth: Payer: Self-pay | Admitting: Psychiatry

## 2023-09-07 DIAGNOSIS — F5105 Insomnia due to other mental disorder: Secondary | ICD-10-CM

## 2023-09-07 MED ORDER — TEMAZEPAM 30 MG PO CAPS: ORAL_CAPSULE | ORAL | 0 refills | Status: DC

## 2023-09-07 NOTE — Telephone Encounter (Signed)
 LF 12/23, pended temazepam to [facility] on [address]

## 2023-09-07 NOTE — Telephone Encounter (Signed)
 Pt called asking for a refill on her temazepam 30 mg. Pharmacy is [facility] on [address]

## 2023-09-10 DIAGNOSIS — E7800 Pure hypercholesterolemia, unspecified: Secondary | ICD-10-CM | POA: Diagnosis not present

## 2023-09-10 DIAGNOSIS — K219 Gastro-esophageal reflux disease without esophagitis: Secondary | ICD-10-CM | POA: Diagnosis not present

## 2023-09-10 DIAGNOSIS — Z6838 Body mass index (BMI) 38.0-38.9, adult: Secondary | ICD-10-CM | POA: Diagnosis not present

## 2023-09-23 ENCOUNTER — Other Ambulatory Visit: Payer: Self-pay | Admitting: Psychiatry

## 2023-09-23 DIAGNOSIS — F3342 Major depressive disorder, recurrent, in full remission: Secondary | ICD-10-CM

## 2023-10-13 ENCOUNTER — Telehealth: Payer: Self-pay | Admitting: Psychiatry

## 2023-10-13 ENCOUNTER — Other Ambulatory Visit: Payer: Self-pay

## 2023-10-13 DIAGNOSIS — F900 Attention-deficit hyperactivity disorder, predominantly inattentive type: Secondary | ICD-10-CM

## 2023-10-13 MED ORDER — METHYLPHENIDATE HCL ER (OSM) 36 MG PO TBCR
72.0000 mg | EXTENDED_RELEASE_TABLET | Freq: Every day | ORAL | 0 refills | Status: DC
Start: 2023-10-13 — End: 2023-11-23

## 2023-10-13 NOTE — Telephone Encounter (Signed)
 Pended 60-day supply of Concerta  72 mg to [facility] [address]

## 2023-10-13 NOTE — Telephone Encounter (Signed)
 Pt lvm that she needs a refill on her concerta  36 mg po cr. Pharmacy is [facility] on [address] Next appt in june

## 2023-10-14 NOTE — Telephone Encounter (Signed)
 No further review

## 2023-10-16 ENCOUNTER — Other Ambulatory Visit: Payer: Self-pay | Admitting: Psychiatry

## 2023-10-16 DIAGNOSIS — F3342 Major depressive disorder, recurrent, in full remission: Secondary | ICD-10-CM

## 2023-10-16 DIAGNOSIS — F900 Attention-deficit hyperactivity disorder, predominantly inattentive type: Secondary | ICD-10-CM

## 2023-11-23 ENCOUNTER — Encounter: Payer: Self-pay | Admitting: Psychiatry

## 2023-11-23 ENCOUNTER — Telehealth: Payer: Self-pay | Admitting: Psychiatry

## 2023-11-23 ENCOUNTER — Ambulatory Visit: Payer: Medicare Other | Admitting: Psychiatry

## 2023-11-23 DIAGNOSIS — F3342 Major depressive disorder, recurrent, in full remission: Secondary | ICD-10-CM

## 2023-11-23 DIAGNOSIS — K080 Exfoliation of teeth due to systemic causes: Secondary | ICD-10-CM | POA: Diagnosis not present

## 2023-11-23 DIAGNOSIS — F5105 Insomnia due to other mental disorder: Secondary | ICD-10-CM | POA: Diagnosis not present

## 2023-11-23 DIAGNOSIS — F900 Attention-deficit hyperactivity disorder, predominantly inattentive type: Secondary | ICD-10-CM

## 2023-11-23 DIAGNOSIS — F411 Generalized anxiety disorder: Secondary | ICD-10-CM | POA: Diagnosis not present

## 2023-11-23 MED ORDER — TEMAZEPAM 30 MG PO CAPS: ORAL_CAPSULE | ORAL | 1 refills | Status: DC

## 2023-11-23 MED ORDER — METHYLPHENIDATE HCL ER (OSM) 36 MG PO TBCR
72.0000 mg | EXTENDED_RELEASE_TABLET | Freq: Every day | ORAL | 0 refills | Status: DC
Start: 2023-11-23 — End: 2023-12-10

## 2023-11-23 MED ORDER — VILAZODONE HCL 40 MG PO TABS: 40.0000 mg | ORAL_TABLET | Freq: Every day | ORAL | 1 refills | Status: DC

## 2023-11-23 MED ORDER — HYDROXYZINE PAMOATE 25 MG PO CAPS: 25.0000 mg | ORAL_CAPSULE | Freq: Every evening | ORAL | 1 refills | Status: AC

## 2023-11-23 MED ORDER — ARIPIPRAZOLE 5 MG PO TABS: 5.0000 mg | ORAL_TABLET | Freq: Every day | ORAL | 1 refills | Status: DC

## 2023-11-23 MED ORDER — BUPROPION HCL ER (XL) 300 MG PO TB24: 300.0000 mg | ORAL_TABLET | Freq: Every day | ORAL | 1 refills | Status: DC

## 2023-11-23 NOTE — Progress Notes (Signed)
 Kristine Martinez [id number] May 06, 1956 68 y.o.  Subjective:   Patient ID:  Kristine Martinez is a 68 y.o. (DOB Oct 17, 1955) female.  Chief Complaint:  Chief Complaint  Patient presents with   Follow-up   Depression   Anxiety   Fatigue   ADD    Kristine Martinez presents to the office today for follow-up of ADD and depression.   seen August 19, 2018.  Because of concerns about attention and focus Concerta  was increased to 36 mg every morning.  The other medications were continued without change. Doesn't get tasks done.  Can't make myself do it.  Denies feeling depressed despite stressors.  Enjoys gkids and reading. Trouble staying focused.  Wants to increase Concerta .  seen March 16, 2019.  She wanted to increase the Concerta  therefore was increased to 54 mg daily.  The other meds were left unchanged. Generally Good depression and anxiety response with Viibryd  40 + Abilify  5.  No SE  seen June 22, 2019.  She was doing okay with regard to mood and anxiety but did not notice sufficient benefit with the increase in Concerta  to 54 mg daily.  Therefore the dosage was increased to 72 mg daily. Increase in Concerta  noticed a little change but not as much as she'd like to be with productivity.  It's a little better.  GKids staying with her with son temporarily.    Patient reports stable mood and denies depressed or irritable moods.  Patient denies any recent difficulty with anxiety.  Patient denies difficulty with sleep initiation or maintenance but recent anniversary nightmares.  Overall fewer.  7-8 hours.  Otherwise Restoril  helps.  Has awoken screaming.  This is unusual.  Been watching murder mysteries. Denies appetite disturbance.  Patient reports that energy and motivation have been good.    Patient denies any suicidal ideation. Plan no med changes.  04/19/20 appt with following noted: Medicare in Feb.  Picked plan and won't cover Concerta  nor Viibryd .  Disc this at length.   Still doing well with the  meds.  Pleased and doesn't want to change.  Holidays are hard and doesn't want to change at holidays. Plan: BC partial response continue Concerta  72 mg daily Good depression and anxiety response with Viibryd  40 + Abilify  5.   08/14/2020 appointment with following noted: Disc insurance problems with hydroxyzine  and disc reasons why they give a hassle about it.  Needs it to help sleep.  Generally sleep good with occ bad dreams. Still good mood and pleased with meds.  Able to stay on Viibryd  and Concerta  now that turned 68 yo. Foggy tired and can't function with out Concerta .  Helps focus and productivity. Questions about how to get refills.   3 alcoholic sons.  One got rehab and is sober.  One ruined Teacher, adult education. Active [employer] helps. Quit Pepsi and Mtn Dew. Patient reports stable mood and denies depressed or irritable moods.  Patient denies any recent difficulty with anxiety.  Patient denies difficulty with sleep initiation or maintenance. Denies appetite disturbance.  Patient reports that energy and motivation have been good.  Patient denies any difficulty with concentration.  Patient denies any suicidal ideation.  01/14/2021 appt noted: Need another insurance company.  Mail lost $500 dollars of meds and problems with [employer]. Doesn't want to change from Viibryd . Meds working for her and doesn't want change.   Disc insurance problems with hydroxyzine  and disc reasons why they give a hassle about it.  Needs it to help sleep.  Patient  reports stable mood and denies depressed or irritable moods.  Patient denies any recent difficulty with anxiety.  Patient denies difficulty with sleep initiation or maintenance. Denies appetite disturbance.  Patient reports that energy and motivation have been good.  Patient denies any difficulty with concentration.  Patient denies any suicidal ideation. Doing well with stimulants. No SE Repeat liver enzymes normal Plan: no med changes  05/21/21 appt noted: Continues  med. Stressed bc living with 2 alcoholic sons.  Pray a lot and stay in room a lot. Started counseling helpful twice. Continues viibryd  40, Abilify  5 mg, temazepam  30 HS,Hydroxyzine  25 HS, concerta  36 mg AM No SE. Plan: No med changes  10/08/2021 appointment with the following noted: Disc trouble getting Concerta .   Asks about alternatives.  Satisfied with meds otherwise. Patient reports stable mood and denies depressed or irritable moods.  Patient denies any recent difficulty with anxiety but stressed..  Patient denies difficulty with sleep initiation or maintenance. Denies appetite disturbance.  Patient reports that energy and motivation have been good.  Patient denies any difficulty with concentration.  Patient denies any suicidal ideation. Plan: No med changes Continues viibryd  40, Abilify  5 mg, temazepam  30 HS,Hydroxyzine  25 HS, concerta  72 mg AM  03/17/22 appt noted: All kinds of family issues causing anxiety.  Had to get a restraining order on kid whose alcoholic. Don't want to change meds.  Prayer and faith helps.  Unmotivated for 3 weeks. Sleep with door locked for fear of drunk son. Sleep good with meds.  Takes temazepam  and hydroxyzine . No SE.   PCP dx DM and increased thyroid  med. Plan: No med changes Continues viibryd  40, Abilify  5 mg, temazepam  30 HS,Hydroxyzine  25 HS, concerta  72 mg AM  07/21/22 app noted: Son detox twice then rehab [place] and for now better but lost license. She is having a little bit of the break.  He's acknowledging problem.  Homeless and hitting his bottom. He's at the [facility] [place].  She feels it's a very good problem. sHe continues meds. He's had SZ all alcoholic related.  She feels calm and handling it.  She's participated in family tx. Sleep is good with meds.   Concerta  is about the same.  And still helpful. No SE. Plan: No med changes Continues viibryd  40, Abilify  5 mg, temazepam  30 HS,Hydroxyzine  25 HS, concerta  72 mg AM  01/20/23 appt  noted:  Recent PE normal including TSH, B12, etc Complaining of difficulty doing things bc low intereest, energy, motivation. Wonders about Wellbutrin . Son in [place] px in [place].  Plan: Start Wellbutrin  augmentation 150 in the AM and then 300  03/23/23 appt noted: Wellbutrin  helped with the above sx.  She would like to increase No SE with others.  Asks about definition of depression.  Again we discussed anhedonia.  She is not having severe anxiety at this time.  No panic attacks.  She is sleeping adequately with the current medications.  She is not hopeless or having significant negative thoughts.  She would like to see an improvement in energy and motivation a little further if possible by increasing the Wellbutrin  Plan: Continues viibryd  40, Abilify  5 mg, temazepam  30 HS, Hydroxyzine  25 HS, concerta  72 mg AM Ok increase, Wellbutrin  augmentation 450 in the AM   05/25/23 appt noted: Xmas not her favorite bc anniversary of H's death. Concerned about tremor which started about a year ago with greadual worsening.   Reduced it to 300 mg daily.  It is better with  reduction.   Appt with Dr. Winferd Hatter tomorrow.  Her main concern is Parkinson's.   She still feels benefit of Wellbutrin  for energy and focus and productivity.  Still not as productive as she would like. Mood and anxiety "fine" overall.   D in law decided she was gay and leaving her son and the family kids: 10, 52, 67 yo.  Son is handling it ok.  Another son alcoholic. Overall handles things well.  EFA hourly briefly.  11/23/23 appt: Med: viibryd  40, Abilify  5 mg, temazepam  30 HS, Hydroxyzine  25 HS, concerta  72 mg AM,  Wellbutrin  300 in the AM  Run ragged with kids.  11, 14, 5 yo.  D in law left her son and declared she was gay.   Moved out of state and caring for kids.   Tired.  Saw DR. Ross. Good sleep.   No SE No change needed I meds.   Past Psychiatric Medication Trials: Vyvanse, Concerta  72,   Ambien side effects, trazodone,  prazosin, hydroxyzine , Lunesta, temazepam  30  Viibryd , duloxetine 90, Wellbutrin  450 worsened tremor, fluoxetine,   Review of Systems:  Review of Systems  Cardiovascular:  Negative for palpitations.  Musculoskeletal:  Positive for arthralgias and gait problem.  Neurological:  Negative for dizziness and tremors.  Psychiatric/Behavioral:  Positive for decreased concentration. Negative for agitation, behavioral problems, confusion, dysphoric mood, hallucinations, self-injury, sleep disturbance and suicidal ideas. The patient is not nervous/anxious and is not hyperactive.   Normal BP  Medications: I have reviewed the patient's current medications.  Current Outpatient Medications  Medication Sig Dispense Refill   Calcium Carbonate-Vitamin D (CALTRATE 600+D PO) Take by mouth.     calcium citrate-vitamin D (CITRACAL+D) 315-200 MG-UNIT tablet Take 1 tablet by mouth 2 (two) times daily.     empagliflozin (JARDIANCE) 10 MG TABS tablet Take 25 mg by mouth daily.     hydrOXYzine  (ATARAX ) 25 MG tablet TAKE 1 TABLET(25 MG) BY MOUTH AT BEDTIME AS NEEDED 90 tablet 2   levothyroxine  (SYNTHROID ) 100 MCG tablet Take 1 tablet (100 mcg total) by mouth daily before breakfast. 60 tablet 1   methylphenidate  36 MG PO CR tablet Take 2 tablets (72 mg total) by mouth daily. 180 tablet 0   omeprazole (PRILOSEC OTC) 20 MG tablet Take 20 mg by mouth daily.     pantoprazole  (PROTONIX ) 40 MG tablet Take 1 tablet (40 mg total) by mouth daily. 90 tablet 1   thyroid  (ARMOUR) 60 MG tablet      ARIPiprazole  (ABILIFY ) 5 MG tablet Take 1 tablet (5 mg total) by mouth daily. 90 tablet 1   buPROPion  (WELLBUTRIN  XL) 300 MG 24 hr tablet Take 1 tablet (300 mg total) by mouth daily. 90 tablet 1   hydrOXYzine  (VISTARIL ) 25 MG capsule Take 1 capsule (25 mg total) by mouth at bedtime. 90 capsule 1   methylphenidate  (CONCERTA ) 36 MG PO CR tablet Take 2 tablets (72 mg total) by mouth daily. 180 tablet 0   temazepam  (RESTORIL ) 30 MG  capsule TAKE 1 CAPSULE(30 MG) BY MOUTH AT BEDTIME 90 capsule 1   Vilazodone  HCl (VIIBRYD ) 40 MG TABS Take 1 tablet (40 mg total) by mouth daily. 90 tablet 1   No current facility-administered medications for this visit.    Medication Side Effects: None  Allergies: No Known Allergies  Past Medical History:  Diagnosis Date   Allergic rhinitis    Attention deficit disorder (ADD) without hyperactivity    Chronic kidney disease (CKD), active medical management without dialysis,  stage 3 (moderate) (HCC)    Depression    Gastro-esophageal reflux disease without esophagitis    High cholesterol    Hypothyroidism    Migraine with aura    NASH (nonalcoholic steatohepatitis)    OA (osteoarthritis)    Obesity    Osteopenia    Prediabetes    Sacroiliitis (HCC)    Sinusitis    Vitamin B12 deficiency    Vitamin D deficiency     Family History  Problem Relation Age of Onset   Unexplained death Mother 12 - 81   Alzheimer's disease Father    Healthy Sister    Alzheimer's disease Maternal Aunt     Social History   Socioeconomic History   Marital status: Widowed    Spouse name: Not on file   Number of children: Not on file   Years of education: Not on file   Highest education level: Not on file  Occupational History   Not on file  Tobacco Use   Smoking status: Never   Smokeless tobacco: Never  Substance and Sexual Activity   Alcohol use: Never   Drug use: Never   Sexual activity: Not on file  Other Topics Concern   Not on file  Social History Narrative   Left hand    Social Drivers of Health   Financial Resource Strain: Not on file  Food Insecurity: Not on file  Transportation Needs: Not on file  Physical Activity: Not on file  Stress: Not on file  Social Connections: Unknown (10/26/2021)   Received from [facility]   Social Network    Social Network: Not on file  Intimate Partner Violence: Unknown (09/17/2021)   Received from [facility]   HITS    Physically Hurt: Not on file    Insult or Talk Down To: Not on file    Threaten Physical Harm: Not on file    Scream or Curse: Not on file    Past Medical History, Surgical history, Social history, and Family history were reviewed and updated as appropriate.   Please see review of systems for further details on the patient's review from today.   Objective:   Physical Exam:  There were no vitals taken for this visit.  Physical Exam Constitutional:      General: She is not in acute distress.    Appearance: She is well-developed. She is obese.  Musculoskeletal:        General: No deformity.  Neurological:     Mental Status: She is alert and oriented to person, place, and time.     Motor: No tremor.     Coordination: Coordination normal.     Gait: Gait normal.  Psychiatric:        Attention and Perception: She is attentive.        Mood and Affect: Mood is anxious and depressed. Affect is not labile, blunt, angry or tearful.        Speech: Speech normal.        Behavior: Behavior normal. Behavior is not slowed.        Thought Content: Thought content normal. Thought content is not delusional. Thought content does not include homicidal or suicidal ideation.        Cognition and Memory: Cognition normal.        Judgment: Judgment normal.     Comments: Insight is good. No AIM     Lab Review:  No results found for: "NA", "K", "CL", "CO2", "  GLUCOSE", "BUN", "CREATININE", "CALCIUM", "PROT", "ALBUMIN", "AST", "ALT", "ALKPHOS", "BILITOT", "GFRNONAA", "GFRAA"     Component Value Date/Time   WBC 6.2 01/16/2014 0843   RBC 4.74 01/16/2014 0843   HGB 13.2 01/16/2014 0843   HCT 38.9 01/16/2014 0843   PLT 212 01/16/2014 0843   MCV 82.1 01/16/2014 0843   MCH 27.8 01/16/2014 0843   MCHC 33.9 01/16/2014 0843   RDW 15.5 01/16/2014 0843    No results found for: "POCLITH", "LITHIUM"   No results found for: "PHENYTOIN", "PHENOBARB", "VALPROATE", "CBMZ"    .res Assessment: Plan:    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 We discussed Educated difference between anhedonic depression with poor attention and ADHD and overlap of sx in detail.  She doesn't believe she's depressed.  But she does have anhedonia which is fortunately improved with Wellbutrin  added.  She is tolerating well.  Disc drug pricing for generics and GoodRx.  Extensived Disc differnence between insurance plans and coverage of meds. Meds are unique and there aren't cheap alternatives.  continue Concerta  72 mg daily. Tolerating last increase. BP and pulse were OK.   Disc the shortage and she asks about alternatives but not a good generic long acting option. Can't mange TID dosing of stimulants. Discussed potential benefits, risks, and side effects of stimulants with patient to include increased heart rate, palpitations, insomnia, increased anxiety, increased irritability, or decreased appetite.  Instructed patient to contact office if experiencing any significant tolerability issues.  Good depression and anxiety response with Viibryd  40 + Abilify  5.  No SE  Tendency towards anxiety much better with this med combination..  Discussed potential metabolic side effects associated with atypical antipsychotics, as well as potential risk for movement side effects. Advised pt to contact office if movement side effects occur.   Good  response with sleep meds still. Sleep good overall with occ NM.  Not enough to restart NM meds. Hydroxyzine  helps but still needs temazepam . Disc normal sleep stages.     Patient has a history of elevated liver enzymes which have returned to normal lately.  She has not had significant weight loss.  Answered questions about meds and elevated liver enzymes as well as her diagnosis of chronic kidney disease stage II or III and psychiatric medications.  She is not taking any psychiatric medications that should affect her kidneys.  Disc counseling.  She's been to [facility]  Explained her labs and prediabetes and kidney function.  Continues viibryd  40, Abilify  5 mg, temazepam  30 HS,Hydroxyzine  25 HS, concerta  72 mg AM  Reduce Wellbutrin  augmentation 300 in the AM .  BC tremor worse with increase. Disc differences in types and SE.  Fu 2 mos  Nori Beat, MD, DFAPA    Please see After Visit Summary for patient specific instructions.  No future appointments.    No orders of the defined types were placed in this encounter.     -------------------------------

## 2023-11-23 NOTE — Telephone Encounter (Signed)
 Pt is not due for a RF until 6/29. [facility] and [facility] as mentioned in this message. She asks that PA be done, but one was done in January and it was denied. Needs to try other meds.

## 2023-11-23 NOTE — Telephone Encounter (Signed)
 Patient called in stating that today's appt there was a mixup with the pharmacy and she needs her Methylphenidate  72mg  resent to [facility] [address]. Insurance will not cover at [facility] where it was originally sent and patient needs CC to cal insurance to let them know she needs this medication. PH: [phone] Appt 12/9

## 2023-11-24 NOTE — Telephone Encounter (Signed)
 Denied 1/20.  Brand-name Methylphenidate  hcl ER (osm) 36mg  has not met Non-Formulary Exception criteria requirements. Based on the information provided by your doctor, at least three alternative formulary medications in the same drug class or category (including the generic equivalent, if available) have not been tried. In this case, none of the alternative medications have been tried. Alternative medications include (please refer to your plan formulary): Lisdexamfetamine (generic Vyvanse), Amphetamine-Dextroamphetamine immediate-release (IR), Amphetamine-Dextroamphetamine extended-release (ER), Atomoxetine, Methylphenidate  IR, etc. Please note: Methylphenidate  requires prior authorization review.  PA was not done for brand.

## 2023-12-02 ENCOUNTER — Telehealth: Payer: Self-pay

## 2023-12-02 NOTE — Telephone Encounter (Signed)
 Prior Authorization Methylphenidate  36 mg #60/30 [employer]  Approved. Effective Date: 12/01/2023 Authorization Expiration Date: 11/30/2024

## 2023-12-04 ENCOUNTER — Telehealth: Payer: Self-pay | Admitting: Psychiatry

## 2023-12-04 NOTE — Telephone Encounter (Signed)
 Patient called in for refill on Methylphenidate  72mg . Ph: [phone] Appt 12/9 [facility]acy [facility] [address] [place]

## 2023-12-04 NOTE — Telephone Encounter (Signed)
 Told patient it was too early to fill, last got 60 day supply on 4/29. Did tell her that we had gotten PA approval.

## 2023-12-10 ENCOUNTER — Other Ambulatory Visit: Payer: Self-pay

## 2023-12-10 DIAGNOSIS — F900 Attention-deficit hyperactivity disorder, predominantly inattentive type: Secondary | ICD-10-CM

## 2023-12-10 MED ORDER — METHYLPHENIDATE HCL ER (OSM) 36 MG PO TBCR: 72.0000 mg | EXTENDED_RELEASE_TABLET | Freq: Every day | ORAL | 0 refills | Status: DC

## 2023-12-10 NOTE — Telephone Encounter (Signed)
 Pended 90-day supply of methylphenidate  [facility] mg [facility]

## 2024-01-25 DIAGNOSIS — Z1159 Encounter for screening for other viral diseases: Secondary | ICD-10-CM | POA: Diagnosis not present

## 2024-01-25 DIAGNOSIS — E538 Deficiency of other specified B group vitamins: Secondary | ICD-10-CM | POA: Diagnosis not present

## 2024-01-25 DIAGNOSIS — N1831 Chronic kidney disease, stage 3a: Secondary | ICD-10-CM | POA: Diagnosis not present

## 2024-01-25 DIAGNOSIS — R7303 Prediabetes: Secondary | ICD-10-CM | POA: Diagnosis not present

## 2024-01-25 DIAGNOSIS — K7581 Nonalcoholic steatohepatitis (NASH): Secondary | ICD-10-CM | POA: Diagnosis not present

## 2024-01-25 DIAGNOSIS — E039 Hypothyroidism, unspecified: Secondary | ICD-10-CM | POA: Diagnosis not present

## 2024-01-25 DIAGNOSIS — Z0000 Encounter for general adult medical examination without abnormal findings: Secondary | ICD-10-CM | POA: Diagnosis not present

## 2024-01-25 DIAGNOSIS — E559 Vitamin D deficiency, unspecified: Secondary | ICD-10-CM | POA: Diagnosis not present

## 2024-01-25 DIAGNOSIS — E7800 Pure hypercholesterolemia, unspecified: Secondary | ICD-10-CM | POA: Diagnosis not present

## 2024-01-26 ENCOUNTER — Encounter: Payer: Self-pay | Admitting: Psychiatry

## 2024-01-27 DIAGNOSIS — M170 Bilateral primary osteoarthritis of knee: Secondary | ICD-10-CM | POA: Diagnosis not present

## 2024-01-27 DIAGNOSIS — F32A Depression, unspecified: Secondary | ICD-10-CM | POA: Diagnosis not present

## 2024-01-27 DIAGNOSIS — E669 Obesity, unspecified: Secondary | ICD-10-CM | POA: Diagnosis not present

## 2024-01-27 DIAGNOSIS — E8881 Metabolic syndrome: Secondary | ICD-10-CM | POA: Diagnosis not present

## 2024-02-01 DIAGNOSIS — E669 Obesity, unspecified: Secondary | ICD-10-CM | POA: Diagnosis not present

## 2024-02-02 ENCOUNTER — Other Ambulatory Visit: Payer: Self-pay

## 2024-02-18 DIAGNOSIS — M170 Bilateral primary osteoarthritis of knee: Secondary | ICD-10-CM | POA: Diagnosis not present

## 2024-02-19 ENCOUNTER — Other Ambulatory Visit: Payer: Self-pay | Admitting: Psychiatry

## 2024-02-19 DIAGNOSIS — F5105 Insomnia due to other mental disorder: Secondary | ICD-10-CM

## 2024-02-23 DIAGNOSIS — K7581 Nonalcoholic steatohepatitis (NASH): Secondary | ICD-10-CM | POA: Diagnosis not present

## 2024-02-23 DIAGNOSIS — K219 Gastro-esophageal reflux disease without esophagitis: Secondary | ICD-10-CM | POA: Diagnosis not present

## 2024-02-23 DIAGNOSIS — N1831 Chronic kidney disease, stage 3a: Secondary | ICD-10-CM | POA: Diagnosis not present

## 2024-02-28 ENCOUNTER — Other Ambulatory Visit: Payer: Self-pay | Admitting: Psychiatry

## 2024-02-28 DIAGNOSIS — F3342 Major depressive disorder, recurrent, in full remission: Secondary | ICD-10-CM

## 2024-02-29 ENCOUNTER — Telehealth: Payer: Self-pay | Admitting: Psychiatry

## 2024-02-29 DIAGNOSIS — K760 Fatty (change of) liver, not elsewhere classified: Secondary | ICD-10-CM | POA: Diagnosis not present

## 2024-02-29 DIAGNOSIS — Z6841 Body Mass Index (BMI) 40.0 and over, adult: Secondary | ICD-10-CM | POA: Diagnosis not present

## 2024-02-29 DIAGNOSIS — N183 Chronic kidney disease, stage 3 unspecified: Secondary | ICD-10-CM | POA: Diagnosis not present

## 2024-02-29 NOTE — Telephone Encounter (Signed)
 LF 6/26 x 90 days, due 9/18

## 2024-02-29 NOTE — Telephone Encounter (Signed)
 Pt lvm that she needs a refill on her concerta  cr 36 mg. Pharmacy is [facility] at [address]

## 2024-03-03 ENCOUNTER — Other Ambulatory Visit: Payer: Self-pay

## 2024-03-03 DIAGNOSIS — F900 Attention-deficit hyperactivity disorder, predominantly inattentive type: Secondary | ICD-10-CM

## 2024-03-03 MED ORDER — METHYLPHENIDATE HCL ER (OSM) 36 MG PO TBCR: 72.0000 mg | EXTENDED_RELEASE_TABLET | Freq: Every day | ORAL | 0 refills | Status: DC

## 2024-03-03 NOTE — Telephone Encounter (Signed)
 Pended

## 2024-03-04 ENCOUNTER — Other Ambulatory Visit: Payer: Self-pay | Admitting: Internal Medicine

## 2024-03-04 DIAGNOSIS — K760 Fatty (change of) liver, not elsewhere classified: Secondary | ICD-10-CM | POA: Diagnosis not present

## 2024-03-04 DIAGNOSIS — Z860100 Personal history of colon polyps, unspecified: Secondary | ICD-10-CM | POA: Diagnosis not present

## 2024-03-10 ENCOUNTER — Ambulatory Visit
Admission: RE | Admit: 2024-03-10 | Discharge: 2024-03-10 | Disposition: A | Discharge status: Home / Self Care | Source: Ambulatory Visit | Attending: Internal Medicine

## 2024-03-10 DIAGNOSIS — K760 Fatty (change of) liver, not elsewhere classified: Secondary | ICD-10-CM | POA: Diagnosis not present

## 2024-05-24 ENCOUNTER — Encounter: Payer: Self-pay | Admitting: Psychiatry

## 2024-05-24 ENCOUNTER — Ambulatory Visit: Admitting: Psychiatry

## 2024-05-24 DIAGNOSIS — F5105 Insomnia due to other mental disorder: Secondary | ICD-10-CM | POA: Diagnosis not present

## 2024-05-24 DIAGNOSIS — F900 Attention-deficit hyperactivity disorder, predominantly inattentive type: Secondary | ICD-10-CM

## 2024-05-24 DIAGNOSIS — F411 Generalized anxiety disorder: Secondary | ICD-10-CM | POA: Diagnosis not present

## 2024-05-24 DIAGNOSIS — F3342 Major depressive disorder, recurrent, in full remission: Secondary | ICD-10-CM | POA: Diagnosis not present

## 2024-05-24 MED ORDER — METHYLPHENIDATE HCL ER (OSM) 36 MG PO TBCR: 72.0000 mg | EXTENDED_RELEASE_TABLET | Freq: Every day | ORAL | 0 refills | Status: AC

## 2024-05-24 MED ORDER — VILAZODONE HCL 40 MG PO TABS: 40.0000 mg | ORAL_TABLET | Freq: Every day | ORAL | 1 refills | Status: AC

## 2024-05-24 MED ORDER — BUPROPION HCL ER (XL) 300 MG PO TB24: 300.0000 mg | ORAL_TABLET | Freq: Every day | ORAL | 1 refills | Status: AC

## 2024-05-24 MED ORDER — ARIPIPRAZOLE 5 MG PO TABS: 5.0000 mg | ORAL_TABLET | Freq: Every day | ORAL | 1 refills | Status: AC

## 2024-05-24 NOTE — Progress Notes (Signed)
 Kristine Martinez [id number] 1956-03-23 68 y.o.  Subjective:   Patient ID:  Kristine Martinez is a 68 y.o. (DOB 1955/10/22) female.  Chief Complaint:  Chief Complaint  Patient presents with   Follow-up   Depression   Anxiety    Kristine Martinez presents to the office today for follow-up of ADD and depression.   seen August 19, 2018.  Because of concerns about attention and focus Concerta  was increased to 36 mg every morning.  The other medications were continued without change. Doesn't get tasks done.  Can't make myself do it.  Denies feeling depressed despite stressors.  Enjoys gkids and reading. Trouble staying focused.  Wants to increase Concerta .  seen March 16, 2019.  She wanted to increase the Concerta  therefore was increased to 54 mg daily.  The other meds were left unchanged. Generally Good depression and anxiety response with Viibryd  40 + Abilify  5.  No SE  seen June 22, 2019.  She was doing okay with regard to mood and anxiety but did not notice sufficient benefit with the increase in Concerta  to 54 mg daily.  Therefore the dosage was increased to 72 mg daily. Increase in Concerta  noticed a little change but not as much as she'd like to be with productivity.  It's a little better.  GKids staying with her with son temporarily.    Patient reports stable mood and denies depressed or irritable moods.  Patient denies any recent difficulty with anxiety.  Patient denies difficulty with sleep initiation or maintenance but recent anniversary nightmares.  Overall fewer.  7-8 hours.  Otherwise Restoril  helps.  Has awoken screaming.  This is unusual.  Been watching murder mysteries. Denies appetite disturbance.  Patient reports that energy and motivation have been good.    Patient denies any suicidal ideation. Plan no med changes.  04/19/20 appt with following noted: Medicare in Feb.  Picked plan and won't cover Concerta  nor Viibryd .  Disc this at length.   Still doing well with the meds.  Pleased and  doesn't want to change.  Holidays are hard and doesn't want to change at holidays. Plan: BC partial response continue Concerta  72 mg daily Good depression and anxiety response with Viibryd  40 + Abilify  5.   08/14/2020 appointment with following noted: Disc insurance problems with hydroxyzine  and disc reasons why they give a hassle about it.  Needs it to help sleep.  Generally sleep good with occ bad dreams. Still good mood and pleased with meds.  Able to stay on Viibryd  and Concerta  now that turned 68 yo. Foggy tired and can't function with out Concerta .  Helps focus and productivity. Questions about how to get refills.   3 alcoholic sons.  One got rehab and is sober.  One ruined Teacher, Adult [employer]. Active [employer] helps. Quit Pepsi and Mtn Dew. Patient reports stable mood and denies depressed or irritable moods.  Patient denies any recent difficulty with anxiety.  Patient denies difficulty with sleep initiation or maintenance. Denies appetite disturbance.  Patient reports that energy and motivation have been good.  Patient denies any difficulty with concentration.  Patient denies any suicidal ideation.  01/14/2021 appt noted: Need another insurance company.  Mail lost $500 dollars of meds and problems with [employer]. Doesn't want to change from Viibryd . Meds working for her and doesn't want change.   Disc insurance problems with hydroxyzine  and disc reasons why they give a hassle about it.  Needs it to help sleep.  Patient reports stable mood and denies depressed  or irritable moods.  Patient denies any recent difficulty with anxiety.  Patient denies difficulty with sleep initiation or maintenance. Denies appetite disturbance.  Patient reports that energy and motivation have been good.  Patient denies any difficulty with concentration.  Patient denies any suicidal ideation. Doing well with stimulants. No SE Repeat liver enzymes normal Plan: no med changes  05/21/21 appt noted: Continues med. Stressed bc  living with 2 alcoholic sons.  Pray a lot and stay in room a lot. Started counseling helpful twice. Continues viibryd  40, Abilify  5 mg, temazepam  30 HS,Hydroxyzine  25 HS, concerta  36 mg AM No SE. Plan: No med changes  10/08/2021 appointment with the following noted: Disc trouble getting Concerta .   Asks about alternatives.  Satisfied with meds otherwise. Patient reports stable mood and denies depressed or irritable moods.  Patient denies any recent difficulty with anxiety but stressed..  Patient denies difficulty with sleep initiation or maintenance. Denies appetite disturbance.  Patient reports that energy and motivation have been good.  Patient denies any difficulty with concentration.  Patient denies any suicidal ideation. Plan: No med changes Continues viibryd  40, Abilify  5 mg, temazepam  30 HS,Hydroxyzine  25 HS, concerta  72 mg AM  03/17/22 appt noted: All kinds of family issues causing anxiety.  Had to get a restraining order on kid whose alcoholic. Don't want to change meds.  Prayer and faith helps.  Unmotivated for 3 weeks. Sleep with door locked for fear of drunk son. Sleep good with meds.  Takes temazepam  and hydroxyzine . No SE.   PCP dx DM and increased thyroid  med. Plan: No med changes Continues viibryd  40, Abilify  5 mg, temazepam  30 HS,Hydroxyzine  25 HS, concerta  72 mg AM  07/21/22 app noted: Son detox twice then rehab [place] and for now better but lost license. She is having a little bit of the break.  He's acknowledging problem.  Homeless and hitting his bottom. He's at the [facility] [place].  She feels it's a very good problem. sHe continues meds. He's had SZ all alcoholic related.  She feels calm and handling it.  She's participated in family tx. Sleep is good with meds.   Concerta  is about the same.  And still helpful. No SE. Plan: No med changes Continues viibryd  40, Abilify  5 mg, temazepam  30 HS,Hydroxyzine  25 HS, concerta  72 mg AM  01/20/23 appt noted:  Recent PE  normal including TSH, B12, etc Complaining of difficulty doing things bc low intereest, energy, motivation. Wonders about Wellbutrin . Son in [place] px [place].  Plan: Start Wellbutrin  augmentation 150 in the AM and then 300  03/23/23 appt noted: Wellbutrin  helped with the above sx.  She would like to increase No SE with others.  Asks about definition of depression.  Again we discussed anhedonia.  She is not having severe anxiety at this time.  No panic attacks.  She is sleeping adequately with the current medications.  She is not hopeless or having significant negative thoughts.  She would like to see an improvement in energy and motivation a little further if possible by increasing the Wellbutrin  Plan: Continues viibryd  40, Abilify  5 mg, temazepam  30 HS, Hydroxyzine  25 HS, concerta  72 mg AM Ok increase, Wellbutrin  augmentation 450 in the AM   05/25/23 appt noted: Xmas not her favorite bc anniversary of H's death. Concerned about tremor which started about a year ago with greadual worsening.   Reduced it to 300 mg daily.  It is better with reduction.   Appt with Dr.  Tat tomorrow.  Her main concern is Parkinson's.   She still feels benefit of Wellbutrin  for energy and focus and productivity.  Still not as productive as she would like. Mood and anxiety fine overall.   D in law decided she was gay and leaving her son and the family kids: 55, 27, 81 yo.  Son is handling it ok.  Another son alcoholic. Overall handles things well.  EFA hourly briefly.  11/23/23 appt: Med: viibryd  40, Abilify  5 mg, temazepam  30 HS, Hydroxyzine  25 HS, concerta  72 mg AM,  Wellbutrin  300 in the AM  Run ragged with kids.  11, 14, 5 yo.  D in law left her son and declared she was gay.   Moved out of state and caring for kids.   Tired.  Saw DR. Ross. Good sleep.   No SE No change needed I meds.  05/24/24 appt noted:  Med: viibryd  40, Abilify  5 mg, temazepam  30 HS, Hydroxyzine  25 HS, concerta  72 mg AM,   Wellbutrin  300 in the AM , propranolol 10 mg BID for cirrhosis Started tirzepatide and lost 21#. Compouded 4 mos $688.  kids.  11, 14, 5 yo. Normally sleep good with meds.   Overall mood stable and managed. Dx cirrhosis and hasn't had it explained. Will meet with doc next week.   Several medical questions.  Including vitamin D. No problems with meds.   PCP Alm Haas from Dr. Okey   Past Psychiatric Medication Trials: Vyvanse, Concerta  72,   Ambien side effects, trazodone, prazosin, hydroxyzine , Lunesta, temazepam  30  Viibryd , duloxetine 90, Wellbutrin  450 worsened tremor, fluoxetine,   Review of Systems:  Review of Systems  Cardiovascular:  Negative for palpitations.  Musculoskeletal:  Positive for arthralgias and gait problem.  Neurological:  Positive for tremors. Negative for dizziness.  Psychiatric/Behavioral:  Positive for decreased concentration. Negative for agitation, behavioral problems, confusion, dysphoric mood, hallucinations, self-injury, sleep disturbance and suicidal ideas. The patient is not nervous/anxious and is not hyperactive.   Normal BP  Medications: I have reviewed the patient's current medications.  Current Outpatient Medications  Medication Sig Dispense Refill   Calcium Carbonate-Vitamin D (CALTRATE 600+D PO) Take by mouth.     calcium citrate-vitamin D (CITRACAL+D) 315-200 MG-UNIT tablet Take 1 tablet by mouth 2 (two) times daily.     empagliflozin (JARDIANCE) 10 MG TABS tablet Take 25 mg by mouth daily.     hydrOXYzine  (ATARAX ) 25 MG tablet TAKE 1 TABLET(25 MG) BY MOUTH AT BEDTIME AS NEEDED 90 tablet 2   hydrOXYzine  (VISTARIL ) 25 MG capsule Take 1 capsule (25 mg total) by mouth at bedtime. 90 capsule 1   levothyroxine  (SYNTHROID ) 100 MCG tablet Take 1 tablet (100 mcg total) by mouth daily before breakfast. 60 tablet 1   methylphenidate  36 MG PO CR tablet Take 2 tablets (72 mg total) by mouth daily. 180 tablet 0   omeprazole (PRILOSEC OTC) 20 MG tablet  Take 20 mg by mouth daily.     pantoprazole  (PROTONIX ) 40 MG tablet Take 1 tablet (40 mg total) by mouth daily. 90 tablet 1   temazepam  (RESTORIL ) 30 MG capsule TAKE 1 CAPSULE(30 MG) BY MOUTH AT BEDTIME 90 capsule 1   thyroid  (ARMOUR) 60 MG tablet      ARIPiprazole  (ABILIFY ) 5 MG tablet Take 1 tablet (5 mg total) by mouth daily. 90 tablet 1   buPROPion  (WELLBUTRIN  XL) 300 MG 24 hr tablet Take 1 tablet (300 mg total) by mouth daily. 90 tablet 1   methylphenidate  (  CONCERTA ) 36 MG PO CR tablet Take 2 tablets (72 mg total) by mouth daily. 180 tablet 0   Vilazodone  HCl (VIIBRYD ) 40 MG TABS Take 1 tablet (40 mg total) by mouth daily. 90 tablet 1   No current facility-administered medications for this visit.    Medication Side Effects: None  Allergies: No Known Allergies  Past Medical History:  Diagnosis Date   Allergic rhinitis    Attention deficit disorder (ADD) without hyperactivity    Chronic kidney disease (CKD), active medical management without dialysis, stage 3 (moderate) (HCC)    Depression    Gastro-esophageal reflux disease without esophagitis    High cholesterol    Hypothyroidism    Migraine with aura    NASH (nonalcoholic steatohepatitis)    OA (osteoarthritis)    Obesity    Osteopenia    Prediabetes    Sacroiliitis    Sinusitis    Vitamin B12 deficiency    Vitamin D deficiency     Family History  Problem Relation Age of Onset   Unexplained death Mother 27 - 45   Alzheimer's disease Father    Healthy Sister    Alzheimer's disease Maternal Aunt     Social History   Socioeconomic History   Marital status: Widowed    Spouse name: Not on file   Number of children: Not on file   Years of education: Not on file   Highest education level: Not on file  Occupational History   Not on file  Tobacco Use   Smoking status: Never   Smokeless tobacco: Never  Substance and Sexual Activity   Alcohol use: Never   Drug use: Never   Sexual activity: Not on file  Other  Topics Concern   Not on file  Social History Narrative   Left hand    Social Drivers of Health   Financial Resource Strain: Not on file  Food Insecurity: Not on file  Transportation Needs: Not on file  Physical Activity: Not on file  Stress: Not on file  Social Connections: Unknown (10/26/2021)   [facility]   Social Network    Social Network: Not on file  Intimate Partner Violence: Unknown (09/17/2021)   Received from [facility]   HITS    Physically Hurt: Not on file    Insult or Talk Down To: Not on file    Threaten Physical Harm: Not on file    Scream or Curse: Not on file    Past Medical History, Surgical history, Social history, and Family history were reviewed and updated as appropriate.   Please see review of systems for further details on the patient's review from today.   Objective:   Physical Exam:  There were no vitals taken for this visit.  Physical Exam Constitutional:      General: She is not in acute distress.    Appearance: She is well-developed. She is obese.  Musculoskeletal:        General: No deformity.  Neurological:     Mental Status: She is alert and oriented to person, place, and time.     Motor: No tremor.     Coordination: Coordination normal.     Gait: Gait normal.  Psychiatric:        Attention and Perception: She is attentive.        Mood and Affect: Mood is anxious. Mood is not depressed. Affect is not labile, blunt, angry or tearful.        Speech: Speech  normal.        Behavior: Behavior normal. Behavior is not slowed.        Thought Content: Thought content normal. Thought content is not delusional. Thought content does not include homicidal or suicidal ideation.        Cognition and Memory: Cognition normal.        Judgment: Judgment normal.     Comments: Insight is good. 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 We discussed Educated difference between anhedonic depression with poor attention and ADHD and overlap of sx in detail.  She doesn't believe she's depressed.  But she does have anhedonia which is fortunately improved with Wellbutrin  added.  She is tolerating well.  Disc drug pricing for generics and GoodRx.  Extensived Disc differnence between insurance plans and coverage of meds. Meds are unique and there aren't cheap alternatives.  continue Concerta  72 mg daily. Tolerating last increase. BP and pulse were OK.   Disc the shortage and she asks about alternatives but not a good generic long acting option. Can't mange TID dosing of stimulants. Discussed potential benefits, risks, and side effects of stimulants with patient to include increased heart rate, palpitations, insomnia, increased anxiety, increased irritability, or decreased appetite.  Instructed patient to contact office if experiencing any significant tolerability issues.  Good depression  and anxiety response with Viibryd  40 + Abilify  5.  No SE  Tendency towards anxiety much better with this med combination..  Discussed potential metabolic side effects associated with atypical antipsychotics, as well as potential risk for movement side effects. Advised pt to contact office if movement side effects occur.   Good response with sleep meds still. Sleep good overall with occ NM.  Not enough to restart NM meds. Hydroxyzine  helps but still needs temazepam . Disc normal sleep stages.     Patient has a history of elevated liver enzymes which have returned to normal lately.  She has not had significant weight loss.  Answered questions about meds and elevated liver enzymes as well as her diagnosis of chronic kidney disease stage II or III and psychiatric medications.  She is not taking any psychiatric medications that should affect her kidneys.  Disc counseling.  She's been to [facility] about cirrhosis.  Continues viibryd  40, Abilify  5 mg, temazepam  30 HS,Hydroxyzine  25 HS, concerta  72 mg AM  Wellbutrin  augmentation 300 in the AM .  BC tremor worse with increase.  Disc differences in types and SE.  Fu  4 mos  Lorene Macintosh, MD, DFAPA    Please see After Visit Summary for patient specific instructions.  No future appointments.    No orders of the defined types were placed in this encounter.     -------------------------------

## 2024-06-13 ENCOUNTER — Telehealth: Payer: Self-pay | Admitting: Psychiatry

## 2024-06-13 DIAGNOSIS — F5105 Insomnia due to other mental disorder: Secondary | ICD-10-CM

## 2024-06-14 ENCOUNTER — Other Ambulatory Visit: Payer: Self-pay

## 2024-06-14 DIAGNOSIS — F5105 Insomnia due to other mental disorder: Secondary | ICD-10-CM

## 2024-06-14 MED ORDER — TEMAZEPAM 30 MG PO CAPS: ORAL_CAPSULE | ORAL | 0 refills | Status: AC

## 2024-06-14 NOTE — Telephone Encounter (Signed)
"  Pended   "

## 2024-06-14 NOTE — Telephone Encounter (Signed)
 Pt lvm req rf of Temazepam      [facility] [address]

## 2024-09-22 ENCOUNTER — Ambulatory Visit: Admitting: Psychiatry
